# Patient Record
Sex: Female | Born: 1990 | Race: White | Hispanic: No | Marital: Married | Smoking: Never smoker
Health system: Southern US, Community
[De-identification: ages and names within clinical notes are randomized; demographics above are authoritative.]

## PROBLEM LIST (undated history)

## (undated) DIAGNOSIS — K219 Gastro-esophageal reflux disease without esophagitis: Secondary | ICD-10-CM

## (undated) DIAGNOSIS — J45909 Unspecified asthma, uncomplicated: Secondary | ICD-10-CM

## (undated) DIAGNOSIS — T7840XA Allergy, unspecified, initial encounter: Secondary | ICD-10-CM

## (undated) HISTORY — PX: TONSILLECTOMY: SUR1361

## (undated) HISTORY — PX: WISDOM TOOTH EXTRACTION: SHX21

---
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
## 2017-04-21 ENCOUNTER — Ambulatory Visit: Payer: BLUE CROSS/BLUE SHIELD | Admitting: Allergy and Immunology

## 2018-06-22 DIAGNOSIS — J45909 Unspecified asthma, uncomplicated: Secondary | ICD-10-CM | POA: Insufficient documentation

## 2018-06-22 DIAGNOSIS — K219 Gastro-esophageal reflux disease without esophagitis: Secondary | ICD-10-CM | POA: Insufficient documentation

## 2018-08-02 ENCOUNTER — Ambulatory Visit: Payer: BLUE CROSS/BLUE SHIELD | Admitting: Podiatry

## 2018-08-02 ENCOUNTER — Other Ambulatory Visit: Payer: Self-pay | Admitting: Podiatry

## 2018-08-02 ENCOUNTER — Ambulatory Visit (INDEPENDENT_AMBULATORY_CARE_PROVIDER_SITE_OTHER): Payer: BLUE CROSS/BLUE SHIELD

## 2018-08-02 ENCOUNTER — Encounter: Payer: Self-pay | Admitting: Podiatry

## 2018-08-02 DIAGNOSIS — M2580 Other specified joint disorders, unspecified joint: Secondary | ICD-10-CM

## 2018-08-02 DIAGNOSIS — M722 Plantar fascial fibromatosis: Secondary | ICD-10-CM

## 2018-08-02 NOTE — Progress Notes (Signed)
  Subjective:  Patient ID: Brandy Coleman, female    DOB: 03-27-1991,  MRN: [id number] HPI Chief Complaint  Patient presents with  . Foot Pain    Plantar forefoot and arch left - aching x 3 weeks, AM pain, noticed pain oafter wearing some non-supportive shoes all day shopping, tried ice, Ibuprofen and wearing sneakers more often, better  . New Patient (Initial Visit)    28 y.o. female presents with the above complaint.   ROS: Denies fever chills nausea vomiting muscle aches pains calf pain back pain chest pain shortness of breath.  No past medical history on file.   Current Outpatient Medications:  .  albuterol (PROVENTIL HFA;VENTOLIN HFA) 108 (90 Base) MCG/ACT inhaler, 2 puffs q.i.d. p.r.n. short of breath, wheezing, or cough, Disp: , Rfl:  .  fluticasone (FLONASE) 50 MCG/ACT nasal spray, Place into both nostrils daily., Disp: , Rfl:  .  JUNEL FE 1/20 1-20 MG-MCG tablet, Take 1 tablet by mouth daily., Disp: , Rfl:  .  montelukast (SINGULAIR) 10 MG tablet, Take 10 mg by mouth daily., Disp: , Rfl:   No Known Allergies Review of Systems Objective:  There were no vitals filed for this visit.  General: Well developed, nourished, in no acute distress, alert and oriented x3   Dermatological: Skin is warm, dry and supple bilateral. Nails x 10 are well maintained; remaining integument appears unremarkable at this time. There are no open sores, no preulcerative lesions, no rash or signs of infection present.  Vascular: Dorsalis Pedis artery and Posterior Tibial artery pedal pulses are 2/4 bilateral with immedate capillary fill time. Pedal hair growth present. No varicosities and no lower extremity edema present bilateral.   Neruologic: Grossly intact via light touch bilateral. Vibratory intact via tuning fork bilateral. Protective threshold with Semmes Wienstein monofilament intact to all pedal sites bilateral. Patellar and Achilles deep tendon reflexes 2+ bilateral. No Babinski or clonus noted  bilateral.   Musculoskeletal: No gross boney pedal deformities bilateral. No pain, crepitus, or limitation noted with foot and ankle range of motion bilateral. Muscular strength 5/5 in all groups tested bilateral.  Pain on range of motion of the first metatarsal phalangeal joint particularly plantar flexing and she has pain on palpation of the tibial sesamoid.  Gait: Unassisted, Nonantalgic.    Radiographs:  Radiographs taken today demonstrate hallux valgus deformity with tibial sesamoid sitting directly on the crista of the head of the first metatarsal.  There is no fracture of the sesamoid.  Assessment & Plan:   Assessment: Sesamoiditis with hallux valgus deformity.  Plan: Discussed the possible need for orthotics or an MRI should this continue.     Max T. Carson, [place]

## 2019-04-12 ENCOUNTER — Encounter
Disposition: A | Discharge status: Home / Self Care | Payer: Self-pay | Source: Ambulatory Visit | Attending: Obstetrics and Gynecology

## 2019-04-12 ENCOUNTER — Ambulatory Visit: Payer: BC Managed Care – PPO | Admitting: Anesthesiology

## 2019-04-12 ENCOUNTER — Encounter: Payer: Self-pay | Admitting: Emergency Medicine

## 2019-04-12 ENCOUNTER — Ambulatory Visit
Admit: 2019-04-12 | Discharge: 2019-04-12 | Disposition: A | Discharge status: Home / Self Care | Payer: BC Managed Care – PPO | Source: Ambulatory Visit | Attending: Obstetrics and Gynecology | Admitting: Obstetrics and Gynecology

## 2019-04-12 ENCOUNTER — Other Ambulatory Visit
Admission: RE | Admit: 2019-04-12 | Payer: BC Managed Care – PPO | Source: Ambulatory Visit | Admitting: Obstetrics and Gynecology

## 2019-04-12 DIAGNOSIS — O00102 Left tubal pregnancy without intrauterine pregnancy: Secondary | ICD-10-CM | POA: Diagnosis present

## 2019-04-12 DIAGNOSIS — J4520 Mild intermittent asthma, uncomplicated: Secondary | ICD-10-CM | POA: Insufficient documentation

## 2019-04-12 DIAGNOSIS — Z6835 Body mass index (BMI) 35.0-35.9, adult: Secondary | ICD-10-CM | POA: Diagnosis not present

## 2019-04-12 DIAGNOSIS — Z79899 Other long term (current) drug therapy: Secondary | ICD-10-CM | POA: Insufficient documentation

## 2019-04-12 DIAGNOSIS — E669 Obesity, unspecified: Secondary | ICD-10-CM | POA: Insufficient documentation

## 2019-04-12 DIAGNOSIS — Z8349 Family history of other endocrine, nutritional and metabolic diseases: Secondary | ICD-10-CM | POA: Diagnosis not present

## 2019-04-12 DIAGNOSIS — Z7951 Long term (current) use of inhaled steroids: Secondary | ICD-10-CM | POA: Insufficient documentation

## 2019-04-12 DIAGNOSIS — Z20828 Contact with and (suspected) exposure to other viral communicable diseases: Secondary | ICD-10-CM | POA: Diagnosis not present

## 2019-04-12 DIAGNOSIS — K219 Gastro-esophageal reflux disease without esophagitis: Secondary | ICD-10-CM | POA: Diagnosis not present

## 2019-04-12 DIAGNOSIS — Z8249 Family history of ischemic heart disease and other diseases of the circulatory system: Secondary | ICD-10-CM | POA: Diagnosis not present

## 2019-04-12 HISTORY — DX: Gastro-esophageal reflux disease without esophagitis: K21.9

## 2019-04-12 HISTORY — DX: Allergy, unspecified, initial encounter: T78.40XA

## 2019-04-12 HISTORY — PX: DIAGNOSTIC LAPAROSCOPY WITH REMOVAL OF ECTOPIC PREGNANCY: SHX6449

## 2019-04-12 HISTORY — PX: LAPAROSCOPIC UNILATERAL SALPINGECTOMY: SHX5934

## 2019-04-12 HISTORY — DX: Unspecified asthma, uncomplicated: J45.909

## 2019-04-12 LAB — CBC
HCT: 42.1 % (ref 36.0–46.0)
Hemoglobin: 13.9 g/dL (ref 12.0–15.0)
MCH: 28.6 pg (ref 26.0–34.0)
MCHC: 33 g/dL (ref 30.0–36.0)
MCV: 86.6 fL (ref 80.0–100.0)
Platelets: 233 10*3/uL (ref 150–400)
RBC: 4.86 MIL/uL (ref 3.87–5.11)
RDW: 13 % (ref 11.5–15.5)
WBC: 10.3 10*3/uL (ref 4.0–10.5)
nRBC: 0 % (ref 0.0–0.2)

## 2019-04-12 LAB — TYPE AND SCREEN
ABO/RH(D): O POS
Antibody Screen: NEGATIVE

## 2019-04-12 LAB — BASIC METABOLIC PANEL
Anion gap: 10 (ref 5–15)
BUN: 9 mg/dL (ref 6–20)
CO2: 24 mmol/L (ref 22–32)
Calcium: 9.3 mg/dL (ref 8.9–10.3)
Chloride: 104 mmol/L (ref 98–111)
Creatinine, Ser: 0.89 mg/dL (ref 0.44–1.00)
GFR calc Af Amer: >60 mL/min (ref >60)
GFR calc non Af Amer: >60 mL/min (ref >60)
Glucose, Bld: 111 mg/dL — ABNORMAL HIGH (ref 70–99)
Potassium: 4 mmol/L (ref 3.5–5.1)
Sodium: 138 mmol/L (ref 135–145)

## 2019-04-12 LAB — SARS CORONAVIRUS 2 BY RT PCR (HOSPITAL ORDER, PERFORMED IN ~~LOC~~ HOSPITAL LAB): SARS Coronavirus 2: NEGATIVE

## 2019-04-12 LAB — ABO/RH: ABO/RH(D): O POS

## 2019-04-12 SURGERY — LAPAROSCOPY, WITH ECTOPIC PREGNANCY SURGICAL TREATMENT
Anesthesia: General | Site: Abdomen | Laterality: Left

## 2019-04-12 MED ORDER — DEXAMETHASONE SODIUM PHOSPHATE 10 MG/ML IJ SOLN
INTRAMUSCULAR | Status: DC | PRN
  Administered 2019-04-12: 10 mg via INTRAVENOUS

## 2019-04-12 MED ORDER — ACETAMINOPHEN 500 MG PO TABS
1000.0000 mg | ORAL_TABLET | ORAL | Status: AC
  Administered 2019-04-12: 15:00:00 1000 mg via ORAL

## 2019-04-12 MED ORDER — SUCCINYLCHOLINE CHLORIDE 20 MG/ML IJ SOLN
INTRAMUSCULAR | Status: DC | PRN
  Administered 2019-04-12: 200 mg via INTRAVENOUS

## 2019-04-12 MED ORDER — FAMOTIDINE 20 MG PO TABS
ORAL_TABLET | ORAL | Status: AC
  Administered 2019-04-12: 20 mg via ORAL
  Filled 2019-04-12: qty 1

## 2019-04-12 MED ORDER — ROCURONIUM BROMIDE 50 MG/5ML IV SOLN
INTRAVENOUS | Status: AC
  Filled 2019-04-12: qty 1

## 2019-04-12 MED ORDER — PROMETHAZINE HCL 25 MG/ML IJ SOLN: 6.2500 mg | INTRAMUSCULAR | Status: DC | PRN

## 2019-04-12 MED ORDER — OXYCODONE HCL 5 MG/5ML PO SOLN: 5.0000 mg | Freq: Once | ORAL | Status: AC | PRN

## 2019-04-12 MED ORDER — ONDANSETRON HCL 4 MG/2ML IJ SOLN
INTRAMUSCULAR | Status: DC | PRN
  Administered 2019-04-12: 4 mg via INTRAVENOUS

## 2019-04-12 MED ORDER — GABAPENTIN 800 MG PO TABS: 800.0000 mg | ORAL_TABLET | Freq: Every day | ORAL | 0 refills | Status: DC

## 2019-04-12 MED ORDER — SUGAMMADEX SODIUM 200 MG/2ML IV SOLN
INTRAVENOUS | Status: DC | PRN
  Administered 2019-04-12: 199.6 mg via INTRAVENOUS

## 2019-04-12 MED ORDER — GABAPENTIN 300 MG PO CAPS
ORAL_CAPSULE | ORAL | Status: AC
  Administered 2019-04-12: 15:00:00 300 mg via ORAL
  Filled 2019-04-12: qty 1

## 2019-04-12 MED ORDER — FENTANYL CITRATE (PF) 100 MCG/2ML IJ SOLN
INTRAMUSCULAR | Status: AC
  Filled 2019-04-12: qty 2

## 2019-04-12 MED ORDER — OXYCODONE HCL 5 MG PO TABS
ORAL_TABLET | ORAL | Status: AC
  Filled 2019-04-12: qty 1

## 2019-04-12 MED ORDER — LACTATED RINGERS IV SOLN
INTRAVENOUS | Status: DC
  Administered 2019-04-12: 13:00:00 125 mL via INTRAVENOUS

## 2019-04-12 MED ORDER — ACETAMINOPHEN 500 MG PO TABS: 1000.0000 mg | ORAL_TABLET | Freq: Four times a day (QID) | ORAL | 0 refills | Status: AC

## 2019-04-12 MED ORDER — FENTANYL CITRATE (PF) 100 MCG/2ML IJ SOLN
INTRAMUSCULAR | Status: DC | PRN
  Administered 2019-04-12: 100 ug via INTRAVENOUS

## 2019-04-12 MED ORDER — ONDANSETRON HCL 4 MG/2ML IJ SOLN
INTRAMUSCULAR | Status: AC
  Filled 2019-04-12: qty 2

## 2019-04-12 MED ORDER — LACTATED RINGERS IV SOLN
INTRAVENOUS | Status: DC
  Administered 2019-04-12: 13:00:00 via INTRAVENOUS

## 2019-04-12 MED ORDER — DEXMEDETOMIDINE HCL IN NACL 200 MCG/50ML IV SOLN
INTRAVENOUS | Status: DC | PRN
  Administered 2019-04-12: 4 ug via INTRAVENOUS
  Administered 2019-04-12: 8 ug via INTRAVENOUS
  Administered 2019-04-12: 4 ug via INTRAVENOUS

## 2019-04-12 MED ORDER — SUGAMMADEX SODIUM 200 MG/2ML IV SOLN
INTRAVENOUS | Status: AC
  Filled 2019-04-12: qty 2

## 2019-04-12 MED ORDER — GABAPENTIN 300 MG PO CAPS
300.0000 mg | ORAL_CAPSULE | ORAL | Status: AC
  Administered 2019-04-12: 15:00:00 300 mg via ORAL

## 2019-04-12 MED ORDER — LIDOCAINE HCL (CARDIAC) PF 100 MG/5ML IV SOSY
PREFILLED_SYRINGE | INTRAVENOUS | Status: DC | PRN
  Administered 2019-04-12: 100 mg via INTRAVENOUS

## 2019-04-12 MED ORDER — DOCUSATE SODIUM 100 MG PO CAPS: 100.0000 mg | ORAL_CAPSULE | Freq: Two times a day (BID) | ORAL | 0 refills | Status: DC

## 2019-04-12 MED ORDER — MIDAZOLAM HCL 2 MG/2ML IJ SOLN
INTRAMUSCULAR | Status: DC | PRN
  Administered 2019-04-12: 2 mg via INTRAVENOUS

## 2019-04-12 MED ORDER — BUPIVACAINE HCL 0.5 % IJ SOLN
INTRAMUSCULAR | Status: DC | PRN
  Administered 2019-04-12: 10 mL

## 2019-04-12 MED ORDER — PROPOFOL 10 MG/ML IV BOLUS
INTRAVENOUS | Status: AC
  Filled 2019-04-12: qty 20

## 2019-04-12 MED ORDER — MEPERIDINE HCL 50 MG/ML IJ SOLN: 6.2500 mg | INTRAMUSCULAR | Status: DC | PRN

## 2019-04-12 MED ORDER — OXYCODONE HCL 5 MG PO CAPS: 5.0000 mg | ORAL_CAPSULE | Freq: Four times a day (QID) | ORAL | 0 refills | Status: DC | PRN

## 2019-04-12 MED ORDER — IBUPROFEN 800 MG PO TABS: 800.0000 mg | ORAL_TABLET | Freq: Three times a day (TID) | ORAL | 1 refills | Status: AC

## 2019-04-12 MED ORDER — LIDOCAINE HCL (PF) 2 % IJ SOLN
INTRAMUSCULAR | Status: AC
  Filled 2019-04-12: qty 10

## 2019-04-12 MED ORDER — OXYCODONE HCL 5 MG PO TABS
5.0000 mg | ORAL_TABLET | Freq: Once | ORAL | Status: AC | PRN
  Administered 2019-04-12: 18:00:00 5 mg via ORAL

## 2019-04-12 MED ORDER — FAMOTIDINE 20 MG PO TABS
20.0000 mg | ORAL_TABLET | Freq: Once | ORAL | Status: AC
  Administered 2019-04-12: 15:00:00 20 mg via ORAL

## 2019-04-12 MED ORDER — DEXAMETHASONE SODIUM PHOSPHATE 10 MG/ML IJ SOLN
INTRAMUSCULAR | Status: AC
  Filled 2019-04-12: qty 1

## 2019-04-12 MED ORDER — ACETAMINOPHEN 500 MG PO TABS
ORAL_TABLET | ORAL | Status: AC
  Administered 2019-04-12: 15:00:00 1000 mg via ORAL
  Filled 2019-04-12: qty 2

## 2019-04-12 MED ORDER — MIDAZOLAM HCL 2 MG/2ML IJ SOLN
INTRAMUSCULAR | Status: AC
  Filled 2019-04-12: qty 2

## 2019-04-12 MED ORDER — FENTANYL CITRATE (PF) 100 MCG/2ML IJ SOLN: 25.0000 ug | INTRAMUSCULAR | Status: DC | PRN

## 2019-04-12 MED ORDER — PROPOFOL 10 MG/ML IV BOLUS
INTRAVENOUS | Status: DC | PRN
  Administered 2019-04-12: 200 mg via INTRAVENOUS

## 2019-04-12 MED ORDER — ROCURONIUM BROMIDE 100 MG/10ML IV SOLN
INTRAVENOUS | Status: DC | PRN
  Administered 2019-04-12: 40 mg via INTRAVENOUS

## 2019-04-12 SURGICAL SUPPLY — 51 items
BAG URINE DRAINAGE (UROLOGICAL SUPPLIES) ×3 IMPLANT
BLADE SURG SZ11 CARB STEEL (BLADE) ×3 IMPLANT
CATH FOLEY 2WAY  5CC 16FR (CATHETERS) ×2
CATH URTH 16FR FL 2W BLN LF (CATHETERS) ×1 IMPLANT
CHLORAPREP W/TINT 26 (MISCELLANEOUS) ×3 IMPLANT
CLOSURE WOUND 1/4X4 (GAUZE/BANDAGES/DRESSINGS) ×1
COVER WAND RF STERILE (DRAPES) IMPLANT
DERMABOND ADVANCED (GAUZE/BANDAGES/DRESSINGS) ×2
DERMABOND ADVANCED .7 DNX12 (GAUZE/BANDAGES/DRESSINGS) ×1 IMPLANT
DRAPE GENERAL ENDO 106X123.5 (DRAPES) ×3 IMPLANT
DRAPE LEGGINS SURG 28X43 STRL (DRAPES) ×3 IMPLANT
DRAPE STERI POUCH LG 24X46 STR (DRAPES) IMPLANT
DRAPE UNDER BUTTOCK W/FLU (DRAPES) ×3 IMPLANT
DRSG TEGADERM 2-3/8X2-3/4 SM (GAUZE/BANDAGES/DRESSINGS) ×9 IMPLANT
GLOVE BIO SURGEON STRL SZ7 (GLOVE) ×6 IMPLANT
GLOVE INDICATOR 7.5 STRL GRN (GLOVE) ×3 IMPLANT
GOWN STRL REUS W/ TWL LRG LVL3 (GOWN DISPOSABLE) ×2 IMPLANT
GOWN STRL REUS W/TWL LRG LVL3 (GOWN DISPOSABLE) ×4
GRASPER SUT TROCAR 14GX15 (MISCELLANEOUS) ×3 IMPLANT
IRRIGATION STRYKERFLOW (MISCELLANEOUS) ×1 IMPLANT
IRRIGATOR STRYKERFLOW (MISCELLANEOUS) ×3
IV NS 1000ML (IV SOLUTION) ×2
IV NS 1000ML BAXH (IV SOLUTION) ×1 IMPLANT
KIT PINK PAD W/HEAD ARE REST (MISCELLANEOUS) ×3
KIT PINK PAD W/HEAD ARM REST (MISCELLANEOUS) ×1 IMPLANT
KIT TURNOVER CYSTO (KITS) ×3 IMPLANT
LABEL OR SOLS (LABEL) ×3 IMPLANT
LIGASURE LAP MARYLAND 5MM 37CM (ELECTROSURGICAL) IMPLANT
LIGASURE VESSEL 5MM BLUNT TIP (ELECTROSURGICAL) ×3 IMPLANT
NEEDLE FILTER BLUNT 18X 1/2SAF (NEEDLE) ×2
NEEDLE FILTER BLUNT 18X1 1/2 (NEEDLE) ×1 IMPLANT
NS IRRIG 500ML POUR BTL (IV SOLUTION) ×3 IMPLANT
PACK GYN LAPAROSCOPIC (MISCELLANEOUS) ×3 IMPLANT
PAD OB MATERNITY 4.3X12.25 (PERSONAL CARE ITEMS) ×3 IMPLANT
PAD PREP 24X41 OB/GYN DISP (PERSONAL CARE ITEMS) ×3 IMPLANT
POUCH SPECIMEN RETRIEVAL 10MM (ENDOMECHANICALS) ×3 IMPLANT
SCISSORS METZENBAUM CVD 33 (INSTRUMENTS) IMPLANT
SET TUBE SMOKE EVAC HIGH FLOW (TUBING) ×3 IMPLANT
SLEEVE ENDOPATH XCEL 5M (ENDOMECHANICALS) ×3 IMPLANT
SPONGE GAUZE 2X2 8PLY STER LF (GAUZE/BANDAGES/DRESSINGS) ×3
SPONGE GAUZE 2X2 8PLY STRL LF (GAUZE/BANDAGES/DRESSINGS) ×6 IMPLANT
STRIP CLOSURE SKIN 1/4X4 (GAUZE/BANDAGES/DRESSINGS) ×2 IMPLANT
SUT MNCRL AB 4-0 PS2 18 (SUTURE) ×3 IMPLANT
SUT VIC AB 2-0 UR6 27 (SUTURE) ×3 IMPLANT
SUT VIC AB 4-0 SH 27 (SUTURE) ×2
SUT VIC AB 4-0 SH 27XANBCTRL (SUTURE) ×1 IMPLANT
SYR 50ML LL SCALE MARK (SYRINGE) IMPLANT
SYR 5ML LL (SYRINGE) ×3 IMPLANT
TROCAR XCEL NON-BLD 11X100MML (ENDOMECHANICALS) ×3 IMPLANT
TROCAR XCEL NON-BLD 5MMX100MML (ENDOMECHANICALS) ×3 IMPLANT
TUBING ART PRESS 48 MALE/FEM (TUBING) IMPLANT

## 2019-04-12 NOTE — H&P (Signed)
Brandy Coleman is a 28 y.o. female here for No chief complaint on file.  History of Present Illness: Patient present to the clinic today for an ultrasound due to bleeding and discomfort in early pregnancy. Ultrasound demonstrated 4cm adnexal mass with nothing in the uterus. 3K beta hcg 5 days ago. No acute distress  TVUS Today: Uterus anteverted LMP=02/23/2019 No IUP seen Endometrium=7.41mm Free fluid seen in ACDS & PCDS B/L ovaries appear wnl Lt adnexal mass=3.94 x 3.08 x 3.42cm   Past Medical History:  has a past medical history of Chickenpox (02/1999), Gastroesophageal reflux disease without esophagitis (06/22/2018), and Mild intermittent reactive airway disease without complication (06/22/2018).  Past Surgical History:  has a past surgical history that includes Tonsillectomy and wisdom teeth extraction. Family History: family history includes Atrial fibrillation (Abnormal heart rhythm sometimes requiring treatment with blood thinners) in her father; High blood pressure (Hypertension) in her father; Osteopenia in her mother; Thyroid disease in her maternal grandmother; Thyroid disease (age of onset: 43) in her mother. Social History:  reports that she has never smoked. She has never used smokeless tobacco. She reports current alcohol use. She reports that she does not use drugs. OB/GYN History:  OB History    Gravida  0   Para  0   Term  0   Preterm  0   AB  0   Living  0     SAB  0   TAB  0   Ectopic  0   Molar      Multiple  0   Live Births           Allergies: has No Known Allergies. Medications: Current Outpatient Medications:  .  albuterol 90 mcg/actuation inhaler, 2 puffs q.i.d. p.r.n. short of breath, wheezing, or cough, Disp: 1 Inhaler, Rfl: 11 .  albuterol sulfate (PROAIR RESPICLICK) 90 mcg/actuation AePB, Inhale 180 mcg into the lungs every 4 (four) hours., Disp: 1 each, Rfl: 2 .  montelukast (SINGULAIR) 10 mg tablet, Take 1 tablet (10 mg total) by  mouth once daily, Disp: 30 tablet, Rfl: 11 .  nitrofurantoin, macrocrystal-monohydrate, (MACROBID) 100 MG capsule, Take 1 capsule (100 mg total) by mouth 2 (two) times daily for 7 days, Disp: 14 capsule, Rfl: 0 .  norethindrone-ethinyl estradiol (JUNEL FE 1/20, 28,) 1 mg-20 mcg (21)/75 mg (7) tablet, Take 1 tablet by mouth once daily, Disp: 4 Package, Rfl: 3  Review of Systems: No SOB, no palpitations or chest pain, no new lower extremity edema, no nausea or vomiting or bowel or bladder complaints. See HPI for gyn specific ROS.   Exam:   Vitals:   04/12/19 1237  BP: 110/80  Pulse: 106    Constitutional:  General appearance: Well nourished, well developed female in no acute distress.  Neuro/psych:  Normal mood and affect. No gross motor deficits. Tears due to nature of visit CV: RRR Pulm: CTAB Neck:  Supple, normal appearance.  Respiratory:  Normal respiratory effort, no use of accessory muscles Skin:  No visible rashes or external lesions  Impression:   The primary encounter diagnosis was Left tubal pregnancy without intrauterine pregnancy. Diagnoses of Unsure of LMP (last menstrual period) as reason for ultrasound scan and Vaginal bleeding in pregnancy were also pertinent to this visit.  Plan:   1. Ectopic Pregnancy - Risks of ectopic pregnancy in general were d/w the patient including the risk of tubal rupture, the need for emergent surgery, internal bleeding and even death.  Option of surgery -vs- Methotrexate (  MTX) were d/w the patient.  Risks of surgery including bleeding which may require transfusion or reoperation, infection, injury to bowel or other surrounding organs, need for additional procedures including (laparoscopy or) laparotomy were explained to patient and written informed consent was obtained.  Patient has been NPO since 7:30 and she will remain NPO for procedure. Anesthesia and OR aware.   SCDs ordered on call to the OR.  To OR when ready.    Diagnoses and all  orders for this visit:  Left tubal pregnancy without intrauterine pregnancy  Unsure of LMP (last menstrual period) as reason for ultrasound scan -     US OB transvaginal  Vaginal bleeding in pregnancy -     US OB transvaginal  Return in about 2 weeks (around 04/26/2019) for Postop check.

## 2019-04-12 NOTE — Op Note (Signed)
Brandy Coleman PROCEDURE DATE: 04/12/2019  PREOPERATIVE DIAGNOSIS: Ruptured ectopic pregnancy POSTOPERATIVE DIAGNOSIS: Ruptured left fallopian tube ectopic pregnancy PROCEDURE: Laparoscopic left salpingectomy and removal of ectopic pregnancy SURGEON:  Dr. Benjaman Kindler ANESTHESIOLOGIST: Piscitello, Precious Haws, MD Anesthesiologist: Piscitello, Precious Haws, MD CRNA: Lavone Orn, CRNA; Jerrye Noble, CRNA  INDICATIONS: 28 y.o. No obstetric history on file. at Unknown here with the preoperative diagnoses as listed above.  Please refer to preoperative notes for more details. Patient was counseled regarding need for laparoscopic salpingectomy. Risks of surgery including bleeding which may require transfusion or reoperation, infection, injury to bowel or other surrounding organs, need for additional procedures including laparotomy and other postoperative/anesthesia complications were explained to patient.  Written informed consent was obtained.  FINDINGS:  Large amount of hemoperitoneum estimated to be about 193ml of blood and clots.  Dilated left fallopian tube containing ectopic gestation. Fimbriae appeared distorted and ruptured. Small normal appearing uterus, normal right fallopian tube, right ovary and left ovary.  ANESTHESIA: General INTRAVENOUS FLUIDS: 800 ml ESTIMATED BLOOD LOSS: 25 ml HEMOPERITONEUM: 180ml URINE OUTPUT: 100 ml SPECIMENS: Left fallopian tube containing ectopic gestation COMPLICATIONS: None immediate  PROCEDURE IN DETAIL:  The patient was taken to the operating room where general anesthesia was administered and was found to be adequate.  She was placed in the dorsal lithotomy position, and was prepped and draped in a sterile manner.  Her bladder was catheterized for an estimated amount of clear, yellow urine. A weighed speculum was then placed in the patient's vagina and a single tooth tenaculum was applied to the anterior lip of the cervix.  Attention was turned  to the abdomen where an umbilical incision was made with the scalpel.  The Optiview 5-mm trocar and sleeve were then advanced without difficulty. Survey of the entry site was noted to be without damage. The abdomen was then insufflated with carbon dioxide gas and adequate pneumoperitoneum was obtained.  A survey of the patient's pelvis and abdomen revealed the findings above.  A 11-mm port was placed on the left under direct visualization, and a 5-mm right lower quadrant port was then placed under direct visualization.  The Nezhat suction irrigator was then used to suction the hemoperitoneum and irrigate the pelvis.  A survey of the pelvis noted ureter peristalsis deep to the ovarian ligament and not in the surgical field. Attention was then turned to the left fallopian tube and ovary. The left tube was separated from the underlying mesosalpinx and uterine attachment using the Ligasure instrument.  Good hemostasis was noted.  The specimen was placed in an EndoCatch bag and removed from the abdomen intact.  The abdomen was desufflated, and all instruments were removed.  The fascial incision of the 10-mm site was reapproximated with a 0 Vicryl figure-of-eight stitch; and all skin incisions were closed with 4-0 Vicryl and Dermabond. The patient tolerated the procedure well.  All instruments, needles, and sponge counts were correct x 2. The patient was taken to the recovery room in stable condition.   Angelina Pih, MD, MPH

## 2019-04-12 NOTE — Anesthesia Post-op Follow-up Note (Signed)
Anesthesia QCDR form completed.        

## 2019-04-12 NOTE — Transfer of Care (Signed)
Immediate Anesthesia Transfer of Care Note  Patient: Brandy Coleman  Procedure(s) Performed: LAPAROSCOPY WITH REMOVAL OF ECTOPIC PREGNANCY (Left Abdomen) LAPAROSCOPIC LEFT SALPINGECTOMY (Left Abdomen)  Patient Location: PACU  Anesthesia Type:General  Level of Consciousness: awake and drowsy  Airway & Oxygen Therapy: Patient Spontanous Breathing and Patient connected to face mask oxygen  Post-op Assessment: Report given to RN and Post -op Vital signs reviewed and stable  Post vital signs: Reviewed and stable  Last Vitals:  Vitals Value Taken Time  BP 125/72 04/12/19 1730  Temp    Pulse 98 04/12/19 1732  Resp 23 04/12/19 1732  SpO2 100 % 04/12/19 1732  Vitals shown include unvalidated device data.  Last Pain:  Vitals:   04/12/19 1256  TempSrc: Tympanic  PainSc: 0-No pain         Complications: No apparent anesthesia complications

## 2019-04-12 NOTE — Anesthesia Postprocedure Evaluation (Signed)
Anesthesia Post Note  Patient: Brandy Coleman  Procedure(s) Performed: LAPAROSCOPY WITH REMOVAL OF ECTOPIC PREGNANCY (Left Abdomen) LAPAROSCOPIC LEFT SALPINGECTOMY (Left Abdomen)  Patient location during evaluation: PACU Anesthesia Type: General Level of consciousness: awake and alert Pain management: pain level controlled Vital Signs Assessment: post-procedure vital signs reviewed and stable Respiratory status: spontaneous breathing, nonlabored ventilation, respiratory function stable and patient connected to nasal cannula oxygen Cardiovascular status: blood pressure returned to baseline and stable Postop Assessment: no apparent nausea or vomiting Anesthetic complications: no     Last Vitals:  Vitals:   04/12/19 1745 04/12/19 1757  BP: 118/66 118/66  Pulse: 95 84  Resp: 18 17  Temp:  36.6 C  SpO2: 100% 97%    Last Pain:  Vitals:   04/12/19 1757  TempSrc: Tympanic  PainSc: 4                  Precious Haws Oluwatobi Ruppe

## 2019-04-12 NOTE — Anesthesia Preprocedure Evaluation (Addendum)
Anesthesia Evaluation  Patient identified by MRN, date of birth, ID band Patient awake    Reviewed: Allergy & Precautions, H&P , NPO status , Patient's Chart, lab work & pertinent test results  History of Anesthesia Complications Negative for: history of anesthetic complications  Airway Mallampati: II  TM Distance: >3 FB Neck ROM: Full    Dental no notable dental hx. (+) Chipped   Pulmonary neg shortness of breath, asthma , neg sleep apnea,    breath sounds clear to auscultation- rhonchi (-) wheezing      Cardiovascular Exercise Tolerance: Good (-) hypertension(-) angina(-) CAD, (-) Past MI, (-) Cardiac Stents and (-) CABG negative cardio ROS   Rhythm:Regular Rate:Normal - Systolic murmurs and - Diastolic murmurs    Neuro/Psych neg Seizures negative neurological ROS  negative psych ROS   GI/Hepatic Neg liver ROS, GERD  Controlled,  Endo/Other  negative endocrine ROSneg diabetes  Renal/GU negative Renal ROS     Musculoskeletal negative musculoskeletal ROS (+)   Abdominal (+) + obese,   Peds  Hematology negative hematology ROS (+)   Anesthesia Other Findings Past Medical History: No date: Allergies No date: Asthma No date: GERD (gastroesophageal reflux disease)   Reproductive/Obstetrics negative OB ROS (+) Pregnancy (ectopic)                            Anesthesia Physical Anesthesia Plan  ASA: III  Anesthesia Plan: General ETT   Post-op Pain Management:    Induction: Intravenous  PONV Risk Score and Plan: 2 and Ondansetron, Dexamethasone and Midazolam  Airway Management Planned: Oral ETT  Additional Equipment:   Intra-op Plan:   Post-operative Plan: Extubation in OR  Informed Consent: I have reviewed the patients History and Physical, chart, labs and discussed the procedure including the risks, benefits and alternatives for the proposed anesthesia with the patient or  authorized representative who has indicated his/her understanding and acceptance.     Dental advisory given  Plan Discussed with: CRNA and Anesthesiologist  Anesthesia Plan Comments: (Ate breakfast at 0730, will be NPO appropriate at 1530)       Anesthesia Quick Evaluation

## 2019-04-12 NOTE — Anesthesia Procedure Notes (Signed)
Procedure Name: Intubation Date/Time: 04/12/2019 4:16 PM Performed by: Lavone Orn, CRNA Pre-anesthesia Checklist: Patient identified, Emergency Drugs available, Suction available, Patient being monitored and Timeout performed Patient Re-evaluated:Patient Re-evaluated prior to induction Oxygen Delivery Method: Circle system utilized Preoxygenation: Pre-oxygenation with 100% oxygen Induction Type: IV induction Ventilation: Mask ventilation without difficulty Laryngoscope Size: Mac and 3 Grade View: Grade II Tube type: Oral Tube size: 7.0 mm Number of attempts: 1 Airway Equipment and Method: Stylet Placement Confirmation: ETT inserted through vocal cords under direct vision,  positive ETCO2 and breath sounds checked- equal and bilateral Secured at: 22 cm Tube secured with: Tape Dental Injury: Teeth and Oropharynx as per pre-operative assessment

## 2019-04-12 NOTE — Discharge Instructions (Signed)
AMBULATORY SURGERY  DISCHARGE INSTRUCTIONS   1) The drugs that you were given will stay in your system until tomorrow so for the next 24 hours you should not:  A) Drive an automobile B) Make any legal decisions C) Drink any alcoholic beverage   2) You may resume regular meals tomorrow.  Today it is better to start with liquids and gradually work up to solid foods.  You may eat anything you prefer, but it is better to start with liquids, then soup and crackers, and gradually work up to solid foods.   3) Please notify your doctor immediately if you have any unusual bleeding, trouble breathing, redness and pain at the surgery site, drainage, fever, or pain not relieved by medication. 4)   5) Your post-operative visit with Dr.                                     is: Date:                        Time:    Please call to schedule your post-operative visit.  6) Additional Instructions:          Laparoscopic Tubal Removal for Ectopic Pregnancy Discharge Instructions  Laparoscopic tubal removal is a procedure that removes the fallopian tube containing the ectopic pregnancy.  For the next three days, take ibuprofen and acetaminophen on a schedule, every 8 hours. You can take them together or you can intersperse them, and take one every four hours. I also gave you gabapentin for nighttime, to help you sleep and also to control pain. Take gabapentin medicines at night for at least the next 3 nights. You also have a narcotic, oxycodone, to take as needed if the above medicines don't help.  Postop constipation is a major cause of pain. Stay well hydrated, walk as you tolerate, and take over the counter senna as well as stool softeners if you need them.  RISKS AND COMPLICATIONS   Infection.  Bleeding.  Injury to surrounding organs.  Anesthetic side effects.  Failure of the procedure.  Risks of future ectopic pregnancy on the other side PROCEDURE   You may be given a medicine  to help you relax (sedative) before the procedure. You will be given a medicine to make you sleep (general anesthetic) during the procedure.  A tube will be put down your throat to help your breath while under general anesthesia.  Two small cuts (incisions) are made in the lower abdominal area and one incision is made near the belly button.  Your abdominal area will be inflated with a safe gas (carbon dioxide). This helps give the surgeon room to operate, visualize, and helps the surgeon avoid other organs.  A thin, lighted tube (laparoscope) with a camera attached is inserted into your abdomen through the incision near the belly button. Other small instruments are also inserted through the other abdominal incisions.  The fallopian tube is located and are removed.  After the fallopian tube is removed, the gas is released from the abdomen.  The incisions will be closed with stitches (sutures), and Dermabond. A bandage may be placed over the incisions. AFTER THE PROCEDURE   You will also have some mild abdominal discomfort for 3-7 days. You will be given pain medicine to ease any discomfort.  As long as there are no problems, you may be allowed to go  home. Someone will need to drive you home and be with you for at least 24 hours once home.  You may have some mild discomfort in the throat. This is from the tube placed in your throat while you were sleeping.  You may experience discomfort in the shoulder area from some trapped air between the liver and diaphragm. This sensation is normal and will slowly go away on its own. HOME CARE INSTRUCTIONS   Take all medicines as directed.  Only take over-the-counter or prescription medicines for pain, discomfort, or fever as directed by your caregiver.  Resume daily activities as directed.  Showers are preferred over baths.  You may resume sexual activities in 1 week or as directed.  Do not drive while taking narcotics. SEEK MEDICAL CARE IF:  .  There is increasing abdominal pain.  You feel lightheaded or faint.  You have the chills.  You have an oral temperature above 102 F (38.9 C).  There is pus-like (purulent) drainage from any of the wounds.  You are unable to pass gas or have a bowel movement.  You feel sick to your stomach (nauseous) or throw up (vomit). MAKE SURE YOU:   Understand these instructions.  Will watch your condition.  Will get help right away if you are not doing well or get worse.  ExitCare Patient Information 2013 [place].

## 2019-04-13 ENCOUNTER — Encounter: Payer: Self-pay | Admitting: Obstetrics and Gynecology

## 2019-04-14 LAB — SURGICAL PATHOLOGY

## 2019-07-15 NOTE — L&D Delivery Note (Signed)
Date of delivery: 05/16/2020 Estimated Date of Delivery: 05/14/20 Patient's last menstrual period was 08/08/2019. EGA: [redacted]w[redacted]d  Delivery Note At 9:23 PM a viable female was delivered via Vaginal, Spontaneous (Presentation: Left Occiput Anterior).  APGAR: 2, 9; weight 10 lb 0.1 oz (4540 g).   Placenta status: Spontaneous, Intact.  Cord: 3 vessels with the following complications: tight nuchal x1.  Cord pH: collected but not sent per pediatric team.  Anesthesia: Epidural Episiotomy: None Lacerations: 2nd degree Perineal, deep bilateral vaginal tears (9cm each) Suture Repair: 3.0 vicryl Est. Blood Loss (mL): 1385cc  Mom presented to L&D with IOL for past due date, and suspected macrosomia.  She was given cytotec, augmented with pitocin. AROM'd. epidual placed. Progressed to complete, second stage:  1.5hrs, with delivery of fetal head with restitution to LOT. Nuchal cord was tight and not able to be reduced at the perineum.  Shoulder dystocia encountered.  McRoberts and woods corkscrew performed with right anterior shoulder then delivered, and remainder of newborn delivered without difficulty.  Nuchal reduced immediately upon delivery, and baby was floppy.  Cord immediately cut and pediatric team brought baby to the warmer for evaluation and intervention.  A segment of cord was portioned off and cord gas was collected. Cord blood also collected for typing. Placenta spontaneously delivered, intact, and IV pitocin given for hemorrhage prophylaxis.  There were two deep and long bilateral vaginal tears.  Each were 9cm long and 2cm deep.  They were each closed with running locking stitches and intermittent interrupted sutures of 3-0 vicryl.  The 2nd degree perineal was closed with standard crown and subcuticular stitches. Of note, the majority of the blood loss was from the vaginal lacerations, and not from uterine atony. Her uterus remained firm post-delivery with minimal blood expressed with massage.  No  uterotonics were necessary.   After adequate assessment the pediatric team, baby was placed on mom's chest for skin to skin.    We sang happy birthday to baby Felecia Jan to postpartum.  Baby to Couplet care / Skin to Skin.  Ylonda Storr C Andrw Mcguirt 05/16/2020, 10:52 PM

## 2019-07-19 ENCOUNTER — Ambulatory Visit: Payer: BC Managed Care – PPO | Admitting: Urology

## 2019-07-19 ENCOUNTER — Other Ambulatory Visit: Payer: Self-pay

## 2019-07-19 ENCOUNTER — Encounter: Payer: Self-pay | Admitting: Urology

## 2019-07-19 VITALS — BP 174/107 | HR 99 | Ht 66.0 in | Wt 209.0 lb

## 2019-07-19 DIAGNOSIS — R310 Gross hematuria: Secondary | ICD-10-CM

## 2019-07-19 DIAGNOSIS — N390 Urinary tract infection, site not specified: Secondary | ICD-10-CM | POA: Diagnosis not present

## 2019-07-19 LAB — URINALYSIS, COMPLETE
Bilirubin, UA: NEGATIVE
Glucose, UA: NEGATIVE
Ketones, UA: NEGATIVE
Leukocytes,UA: NEGATIVE
Nitrite, UA: NEGATIVE
Protein,UA: NEGATIVE
RBC, UA: NEGATIVE
Specific Gravity, UA: 1.01 (ref 1.005–1.030)
Urobilinogen, Ur: 0.2 mg/dL (ref 0.2–1.0)
pH, UA: 6.5 (ref 5.0–7.5)

## 2019-07-19 LAB — MICROSCOPIC EXAMINATION: RBC, Urine: NONE SEEN /hpf (ref 0–2)

## 2019-07-19 NOTE — Progress Notes (Signed)
07/19/2019 2:04 PM   Brandy Coleman 1991/06/03 [id number]  Referring provider: Rusty Aus, MD [facility] [place],  [place] [address]  Chief Complaint  Patient presents with  . Urinary Tract Infection    HPI: 29 year old female who presents today for further evaluation of blood in the urine and possible urinary tract infections.  She believes that over the past year, she has had several infections are treated and she is worried that something may be going on.  She stopped taking birth control in the middle of last year she and her husband and actively trying to reproduce with more frequent intercourse.  She is had 1 true documented infection over the past year.  This was on 12/19/2018.  At that time, her urine was grossly positive and she grew E. coli.  At the time, she had frequency urgency dysuria and symptoms consistent with traditional urinary tract infection.  She did have a suspicious appearing urine on 04/07/2019 which was nitrate positive with numerous red blood cells but no white blood cells.  Urine culture on this occasion was negative.  This was in and around the time of having an ectopic pregnancy requiring surgery.  She was treated presumptively for urinary tract infection.  She was having adnexal pain at the time related to her  She also reports that she had a urine checked in mid December which actually was fairly unremarkable.  She says she was asymptomatic at the time and treated by her PCP.  I see no documentation to effectively treated for this.  Recently, she was seen and evaluated on 07/16/2019 at urgent care she had a few red blood cells, 4-10, otherwise her urine was unremarkable.  Rare bacteria.  No leukocytes.  Nitrate negative.  No culture was sent.  She was treated with Cipro as a precaution.  Symptoms at the time included increased frequency, urgency, dysuria and mild gross hematuria.  Her symptoms have improved on this  antibiotic.  She has been taking over-the-counter cranberry pills daily.  She is sexually active.  No issues with constipation.  She does engage in good hygiene techniques.  No history of significant urinary tract infections in the past.   PMH: Past Medical History:  Diagnosis Date  . Allergies   . Asthma   . GERD (gastroesophageal reflux disease)     Surgical History: Past Surgical History:  Procedure Laterality Date  . DIAGNOSTIC LAPAROSCOPY WITH REMOVAL OF ECTOPIC PREGNANCY Left 04/12/2019   Procedure: LAPAROSCOPY WITH REMOVAL OF ECTOPIC PREGNANCY;  Surgeon: Benjaman Kindler, MD;  Location: [facility] ORS;  Service: Gynecology;  Laterality: Left;  . LAPAROSCOPIC UNILATERAL SALPINGECTOMY Left 04/12/2019   Procedure: LAPAROSCOPIC LEFT SALPINGECTOMY;  Surgeon: Benjaman Kindler, MD;  Location: [facility] ORS;  Service: Gynecology;  Laterality: Left;  . TONSILLECTOMY    . WISDOM TOOTH EXTRACTION      Home Medications:  Allergies as of 07/19/2019   No Known Allergies     Medication List       Accurate as of July 19, 2019 11:59 PM. If you have any questions, ask your nurse or doctor.        STOP taking these medications   docusate sodium 100 MG capsule Commonly known as: COLACE Stopped by: Hollice Espy, MD   gabapentin 800 MG tablet Commonly known as: Neurontin Stopped by: Hollice Espy, MD   nitrofurantoin (macrocrystal-monohydrate) 100 MG capsule Commonly known as: MACROBID Stopped by: Hollice Espy, MD   oxycodone 5 MG  capsule Commonly known as: OXY-IR Stopped by: Vanna Scotland, MD     TAKE these medications   albuterol 108 (90 Base) MCG/ACT inhaler Commonly known as: VENTOLIN HFA 2 puffs q.i.d. p.r.n. short of breath, wheezing, or cough   ciprofloxacin 500 MG tablet Commonly known as: CIPRO Take 500 mg by mouth 2 (two) times daily.   fluticasone 50 MCG/ACT nasal spray Commonly known as: FLONASE Place into both nostrils daily.   montelukast 10 MG  tablet Commonly known as: SINGULAIR Take 10 mg by mouth daily.       Allergies: No Known Allergies  Family History: No family history on file.  Social History:  reports that she has never smoked. She has never used smokeless tobacco. She reports current alcohol use. She reports that she does not use drugs.  ROS: UROLOGY Frequent Urination?: Yes Hard to postpone urination?: No Burning/pain with urination?: Yes Get up at night to urinate?: No Leakage of urine?: No Urine stream starts and stops?: No Trouble starting stream?: No Do you have to strain to urinate?: No Blood in urine?: No Urinary tract infection?: No Sexually transmitted disease?: No Injury to kidneys or bladder?: No Painful intercourse?: No Weak stream?: No Currently pregnant?: No Vaginal bleeding?: No Last menstrual period?: N  Gastrointestinal Nausea?: No Vomiting?: No Indigestion/heartburn?: No Diarrhea?: No Constipation?: No  Constitutional Fever: No Night sweats?: No Weight loss?: No Fatigue?: No  Skin Skin rash/lesions?: No Itching?: No  Eyes Blurred vision?: No Double vision?: No  Ears/Nose/Throat Sore throat?: No Sinus problems?: No  Hematologic/Lymphatic Swollen glands?: No Easy bruising?: No  Cardiovascular Leg swelling?: No Chest pain?: No  Respiratory Cough?: No Shortness of breath?: No  Endocrine Excessive thirst?: No  Musculoskeletal Back pain?: No Joint pain?: No  Neurological Headaches?: No Dizziness?: No  Psychologic Depression?: No Anxiety?: No  Physical Exam: BP (!) 174/107   Pulse 99   Ht 5\' 6"  (1.676 m)   Wt 209 lb (94.8 kg)   BMI 33.73 kg/m   Constitutional:  Alert and oriented, No acute distress. HEENT: [place] AT, moist mucus membranes.  Trachea midline, no masses. Cardiovascular: No clubbing, cyanosis, or edema. Respiratory: Normal respiratory effort, no increased work of breathing. Skin: No rashes, bruises or suspicious  lesions. Neurologic: Grossly intact, no focal deficits, moving all 4 extremities. Psychiatric: Normal mood and affect.  Laboratory Data: Lab Results  Component Value Date   WBC 10.3 04/12/2019   HGB 13.9 04/12/2019   HCT 42.1 04/12/2019   MCV 86.6 04/12/2019   PLT 233 04/12/2019    Lab Results  Component Value Date   CREATININE 0.89 04/12/2019   Urinalysis Results for orders placed or performed in visit on 07/19/19  Microscopic Examination   URINE  Result Value Ref Range   WBC, UA 6-10 (A) 0 - 5 /hpf   RBC None seen 0 - 2 /hpf   Epithelial Cells (non renal) 0-10 0 - 10 /hpf   Bacteria, UA Many (A) None seen/Few  Urinalysis, Complete  Result Value Ref Range   Specific Gravity, UA 1.010 1.005 - 1.030   pH, UA 6.5 5.0 - 7.5   Color, UA Straw Yellow   Appearance Ur Cloudy (A) Clear   Leukocytes,UA Negative Negative   Protein,UA Negative Negative/Trace   Glucose, UA Negative Negative   Ketones, UA Negative Negative   RBC, UA Negative Negative   Bilirubin, UA Negative Negative   Urobilinogen, Ur 0.2 0.2 - 1.0 mg/dL   Nitrite, UA Negative Negative  Microscopic Examination See below:     Pertinent Imaging: n/a  Assessment & Plan:    1. Recurrent UTI Relatively healthy 29 year old female with several urinary tract infections this year, although only one is culture proven and documented.  We discussed the pathophysiology of recurrent urinary tract infections.  Increase infection is likely secondary to increasing frequency of intercourse and hormonally related.  At this point time, I do not feel that she has had enough infections, 3 documented in a calendar year to pursue any further evaluation including cystoscopy and renal ultrasound.  We discussed hygiene at length today including good technique.  Agree with daily cranberry tablets which she can increase to twice a day.  Also discussed the addition of probiotic.  I advised her that if she feels like she has a  urinary tract infection, we will see her as a same-day work in if it is during the week for UA urine culture.  If she does have an increasing number of infections, will pursue further evaluation and consideration of postcoital antibiotics.  Antibiotic stewardship was also discussed.  She is agreeable this above plan. - Urinalysis, Complete - Urinalysis, Complete; Future - CULTURE, URINE COMPREHENSIVE; Future  2. Gross hematuria Patient is concerned today about episodes of gross hematuria possibly related to infections  No evidence of microscopic blood in her urine today which is reassuring  For peace of mind, she was asked to drop off another urine in about 6 weeks to ensure that there is no additional concerns for microscopic hematuria to warrant further evaluation.  She is agreeable this plan.  Return for UA drop off in 6 weeks.  Vanna Scotland, MD  [facility] [address], [address] [place], [place] [address] [phone]

## 2019-08-16 ENCOUNTER — Other Ambulatory Visit: Payer: Self-pay | Admitting: Obstetrics and Gynecology

## 2019-08-16 DIAGNOSIS — N6320 Unspecified lump in the left breast, unspecified quadrant: Secondary | ICD-10-CM

## 2019-08-18 ENCOUNTER — Ambulatory Visit
Admission: RE | Admit: 2019-08-18 | Discharge: 2019-08-18 | Disposition: A | Discharge status: Home / Self Care | Payer: BC Managed Care – PPO | Source: Ambulatory Visit | Attending: Obstetrics and Gynecology | Admitting: Obstetrics and Gynecology

## 2019-08-18 DIAGNOSIS — N6320 Unspecified lump in the left breast, unspecified quadrant: Secondary | ICD-10-CM | POA: Insufficient documentation

## 2019-09-01 ENCOUNTER — Other Ambulatory Visit: Payer: BC Managed Care – PPO

## 2019-09-06 ENCOUNTER — Other Ambulatory Visit: Payer: Self-pay

## 2019-09-06 ENCOUNTER — Other Ambulatory Visit: Payer: BC Managed Care – PPO

## 2019-09-06 DIAGNOSIS — N390 Urinary tract infection, site not specified: Secondary | ICD-10-CM

## 2019-09-07 LAB — URINALYSIS, COMPLETE
Bilirubin, UA: NEGATIVE
Glucose, UA: NEGATIVE
Ketones, UA: NEGATIVE
Leukocytes,UA: NEGATIVE
Nitrite, UA: NEGATIVE
Protein,UA: NEGATIVE
RBC, UA: NEGATIVE
Specific Gravity, UA: <1.005 — ABNORMAL LOW (ref 1.005–1.030)
Urobilinogen, Ur: 0.2 mg/dL (ref 0.2–1.0)
pH, UA: 5.5 (ref 5.0–7.5)

## 2019-09-07 LAB — MICROSCOPIC EXAMINATION: RBC, Urine: NONE SEEN /hpf (ref 0–2)

## 2019-09-09 LAB — CULTURE, URINE COMPREHENSIVE

## 2019-10-19 DIAGNOSIS — Z3490 Encounter for supervision of normal pregnancy, unspecified, unspecified trimester: Secondary | ICD-10-CM | POA: Insufficient documentation

## 2019-10-19 LAB — OB RESULTS CONSOLE VARICELLA ZOSTER ANTIBODY, IGG: Varicella: IMMUNE

## 2019-10-19 LAB — OB RESULTS CONSOLE RUBELLA ANTIBODY, IGM: Rubella: IMMUNE

## 2019-10-19 LAB — OB RESULTS CONSOLE HEPATITIS B SURFACE ANTIGEN: Hepatitis B Surface Ag: NEGATIVE

## 2020-04-16 LAB — OB RESULTS CONSOLE GC/CHLAMYDIA
Chlamydia: NEGATIVE
Gonorrhea: NEGATIVE

## 2020-04-16 LAB — OB RESULTS CONSOLE GBS: GBS: NEGATIVE

## 2020-04-16 LAB — OB RESULTS CONSOLE RPR: RPR: NONREACTIVE

## 2020-04-16 LAB — OB RESULTS CONSOLE HIV ANTIBODY (ROUTINE TESTING): HIV: NONREACTIVE

## 2020-05-15 ENCOUNTER — Other Ambulatory Visit: Payer: Self-pay

## 2020-05-15 ENCOUNTER — Inpatient Hospital Stay
Admission: EM | Admit: 2020-05-15 | Discharge: 2020-05-19 | DRG: 806 | Disposition: A | Discharge status: Home / Self Care | Payer: BC Managed Care – PPO | Attending: Obstetrics & Gynecology | Admitting: Obstetrics & Gynecology

## 2020-05-15 ENCOUNTER — Encounter: Payer: Self-pay | Admitting: Obstetrics and Gynecology

## 2020-05-15 DIAGNOSIS — D62 Acute posthemorrhagic anemia: Secondary | ICD-10-CM | POA: Diagnosis not present

## 2020-05-15 DIAGNOSIS — J45909 Unspecified asthma, uncomplicated: Secondary | ICD-10-CM | POA: Diagnosis present

## 2020-05-15 DIAGNOSIS — O99214 Obesity complicating childbirth: Secondary | ICD-10-CM | POA: Diagnosis present

## 2020-05-15 DIAGNOSIS — E669 Obesity, unspecified: Secondary | ICD-10-CM | POA: Diagnosis present

## 2020-05-15 DIAGNOSIS — O3663X0 Maternal care for excessive fetal growth, third trimester, not applicable or unspecified: Principal | ICD-10-CM | POA: Diagnosis present

## 2020-05-15 DIAGNOSIS — Z3A40 40 weeks gestation of pregnancy: Secondary | ICD-10-CM

## 2020-05-15 DIAGNOSIS — O26843 Uterine size-date discrepancy, third trimester: Secondary | ICD-10-CM | POA: Insufficient documentation

## 2020-05-15 DIAGNOSIS — O9952 Diseases of the respiratory system complicating childbirth: Secondary | ICD-10-CM | POA: Diagnosis present

## 2020-05-15 DIAGNOSIS — Z20822 Contact with and (suspected) exposure to covid-19: Secondary | ICD-10-CM | POA: Diagnosis present

## 2020-05-15 DIAGNOSIS — O9081 Anemia of the puerperium: Secondary | ICD-10-CM | POA: Diagnosis not present

## 2020-05-15 DIAGNOSIS — O2243 Hemorrhoids in pregnancy, third trimester: Secondary | ICD-10-CM | POA: Diagnosis present

## 2020-05-15 LAB — CBC
HCT: 41 % (ref 36.0–46.0)
Hemoglobin: 13.8 g/dL (ref 12.0–15.0)
MCH: 28.8 pg (ref 26.0–34.0)
MCHC: 33.7 g/dL (ref 30.0–36.0)
MCV: 85.6 fL (ref 80.0–100.0)
Platelets: 184 10*3/uL (ref 150–400)
RBC: 4.79 MIL/uL (ref 3.87–5.11)
RDW: 14.8 % (ref 11.5–15.5)
WBC: 16.6 10*3/uL — ABNORMAL HIGH (ref 4.0–10.5)
nRBC: 0 % (ref 0.0–0.2)

## 2020-05-15 LAB — RESPIRATORY PANEL BY RT PCR (FLU A&B, COVID)
Influenza A by PCR: NEGATIVE
Influenza B by PCR: NEGATIVE
SARS Coronavirus 2 by RT PCR: NEGATIVE

## 2020-05-15 MED ORDER — LACTATED RINGERS IV SOLN: INTRAVENOUS | Status: DC

## 2020-05-15 MED ORDER — OXYTOCIN-SODIUM CHLORIDE 30-0.9 UT/500ML-% IV SOLN
2.5000 [IU]/h | INTRAVENOUS | Status: DC
  Administered 2020-05-16: 2.5 [IU]/h via INTRAVENOUS
  Filled 2020-05-15: qty 1000

## 2020-05-15 MED ORDER — OXYTOCIN-SODIUM CHLORIDE 30-0.9 UT/500ML-% IV SOLN
1.0000 m[IU]/min | INTRAVENOUS | Status: DC
  Administered 2020-05-16: 4 m[IU]/min via INTRAVENOUS
  Administered 2020-05-16: 2 m[IU]/min via INTRAVENOUS
  Filled 2020-05-15: qty 500

## 2020-05-15 MED ORDER — SODIUM CHLORIDE 0.9 % IV SOLN: 250.0000 mL | INTRAVENOUS | Status: DC | PRN

## 2020-05-15 MED ORDER — ONDANSETRON HCL 4 MG/2ML IJ SOLN
4.0000 mg | Freq: Four times a day (QID) | INTRAMUSCULAR | Status: DC | PRN
  Administered 2020-05-16 (×2): 4 mg via INTRAVENOUS
  Filled 2020-05-15 (×2): qty 2

## 2020-05-15 MED ORDER — LACTATED RINGERS IV SOLN
500.0000 mL | INTRAVENOUS | Status: DC | PRN
  Administered 2020-05-16 – 2020-05-17 (×2): 500 mL via INTRAVENOUS

## 2020-05-15 MED ORDER — MISOPROSTOL 200 MCG PO TABS
ORAL_TABLET | ORAL | Status: AC
  Filled 2020-05-15: qty 4

## 2020-05-15 MED ORDER — LIDOCAINE HCL (PF) 1 % IJ SOLN
INTRAMUSCULAR | Status: AC
  Filled 2020-05-15: qty 30

## 2020-05-15 MED ORDER — AMMONIA AROMATIC IN INHA
RESPIRATORY_TRACT | Status: AC
  Filled 2020-05-15: qty 10

## 2020-05-15 MED ORDER — SODIUM CHLORIDE 0.9% FLUSH: 3.0000 mL | Freq: Two times a day (BID) | INTRAVENOUS | Status: DC

## 2020-05-15 MED ORDER — CALCIUM CARBONATE ANTACID 500 MG PO CHEW: 400.0000 mg | CHEWABLE_TABLET | Freq: Three times a day (TID) | ORAL | Status: DC | PRN

## 2020-05-15 MED ORDER — BUTORPHANOL TARTRATE 1 MG/ML IJ SOLN: 1.0000 mg | INTRAMUSCULAR | Status: DC | PRN

## 2020-05-15 MED ORDER — LIDOCAINE HCL (PF) 1 % IJ SOLN: 30.0000 mL | INTRAMUSCULAR | Status: DC | PRN

## 2020-05-15 MED ORDER — ACETAMINOPHEN 500 MG PO TABS: 1000.0000 mg | ORAL_TABLET | Freq: Four times a day (QID) | ORAL | Status: DC | PRN

## 2020-05-15 MED ORDER — MISOPROSTOL 25 MCG QUARTER TABLET
25.0000 ug | ORAL_TABLET | ORAL | Status: DC | PRN
  Filled 2020-05-15: qty 1

## 2020-05-15 MED ORDER — MISOPROSTOL 25 MCG QUARTER TABLET
25.0000 ug | ORAL_TABLET | ORAL | Status: DC | PRN
  Administered 2020-05-15: 25 ug via VAGINAL
  Filled 2020-05-15 (×2): qty 1

## 2020-05-15 MED ORDER — OXYTOCIN 10 UNIT/ML IJ SOLN
INTRAMUSCULAR | Status: AC
  Filled 2020-05-15: qty 2

## 2020-05-15 MED ORDER — OXYTOCIN BOLUS FROM INFUSION
333.0000 mL | Freq: Once | INTRAVENOUS | Status: AC
  Administered 2020-05-16: 333 mL via INTRAVENOUS

## 2020-05-15 MED ORDER — TERBUTALINE SULFATE 1 MG/ML IJ SOLN: 0.2500 mg | Freq: Once | INTRAMUSCULAR | Status: DC | PRN

## 2020-05-15 MED ORDER — SODIUM CHLORIDE 0.9% FLUSH: 3.0000 mL | INTRAVENOUS | Status: DC | PRN

## 2020-05-15 NOTE — Progress Notes (Signed)
G2P0010 at [redacted]w[redacted]d, LMP of 08/08/2019, c/w early [place] at [redacted]w[redacted]d.  Scheduled for induction of labor for macrosomia on 05/15/2020.   Prenatal provider: [facility] Pregnancy complicated by: 1. Macrosomia 2. Obesity in pregnancy  3. Asthma  4. Elevated 1hr GTT - 3hr WNL  Prenatal Labs: Blood type/Rh O pos   Antibody screen neg  Rubella Immune  Varicella Immune  RPR NR  HBsAg Neg  HIV NR  GC neg  Chlamydia neg  Genetic screening negative  1 hour GTT 158  3 hour GTT 98, 161, 140, 137  GBS Negative    Tdap: given 02/23/2020 Flu: Given 03/26/2020 Contraception: TBD Feeding preference: Breast   ____ Margaretmary Eddy, CNM Certified Nurse Midwife [facility]

## 2020-05-16 ENCOUNTER — Encounter: Payer: Self-pay | Admitting: Obstetrics and Gynecology

## 2020-05-16 ENCOUNTER — Inpatient Hospital Stay: Payer: BC Managed Care – PPO | Admitting: Registered Nurse

## 2020-05-16 LAB — TYPE AND SCREEN
ABO/RH(D): O POS
Antibody Screen: NEGATIVE

## 2020-05-16 LAB — RPR: RPR Ser Ql: NONREACTIVE

## 2020-05-16 MED ORDER — BUPIVACAINE HCL (PF) 0.25 % IJ SOLN
INTRAMUSCULAR | Status: DC | PRN
  Administered 2020-05-16 (×2): 4 mL via EPIDURAL

## 2020-05-16 MED ORDER — PHENYLEPHRINE 40 MCG/ML (10ML) SYRINGE FOR IV PUSH (FOR BLOOD PRESSURE SUPPORT)
80.0000 ug | PREFILLED_SYRINGE | INTRAVENOUS | Status: DC | PRN
  Filled 2020-05-16: qty 10

## 2020-05-16 MED ORDER — EPHEDRINE 5 MG/ML INJ
10.0000 mg | INTRAVENOUS | Status: DC | PRN
  Filled 2020-05-16: qty 2

## 2020-05-16 MED ORDER — FENTANYL 2.5 MCG/ML W/ROPIVACAINE 0.15% IN NS 100 ML EPIDURAL (ARMC)
12.0000 mL/h | EPIDURAL | Status: DC
  Administered 2020-05-16: 12 mL/h via EPIDURAL

## 2020-05-16 MED ORDER — FENTANYL 2.5 MCG/ML W/ROPIVACAINE 0.15% IN NS 100 ML EPIDURAL (ARMC)
EPIDURAL | Status: AC
  Filled 2020-05-16: qty 100

## 2020-05-16 MED ORDER — FAMOTIDINE 20 MG PO TABS
20.0000 mg | ORAL_TABLET | Freq: Every day | ORAL | Status: DC
  Administered 2020-05-17 – 2020-05-19 (×5): 20 mg via ORAL
  Filled 2020-05-16 (×4): qty 1

## 2020-05-16 MED ORDER — LIDOCAINE HCL (PF) 1 % IJ SOLN
INTRAMUSCULAR | Status: DC | PRN
  Administered 2020-05-16: 3 mL

## 2020-05-16 MED ORDER — FLUTICASONE PROPIONATE 50 MCG/ACT NA SUSP
1.0000 | Freq: Every day | NASAL | Status: DC
  Administered 2020-05-17: 1 via NASAL
  Filled 2020-05-16: qty 16

## 2020-05-16 MED ORDER — LACTATED RINGERS IV SOLN: 500.0000 mL | Freq: Once | INTRAVENOUS | Status: AC

## 2020-05-16 MED ORDER — DIPHENHYDRAMINE HCL 50 MG/ML IJ SOLN: 12.5000 mg | INTRAMUSCULAR | Status: DC | PRN

## 2020-05-16 MED ORDER — METHYLERGONOVINE MALEATE 0.2 MG/ML IJ SOLN
INTRAMUSCULAR | Status: AC
  Filled 2020-05-16: qty 1

## 2020-05-16 MED ORDER — LIDOCAINE-EPINEPHRINE (PF) 1.5 %-1:200000 IJ SOLN
INTRAMUSCULAR | Status: DC | PRN
  Administered 2020-05-16: 3 mL via PERINEURAL

## 2020-05-16 MED ORDER — MONTELUKAST SODIUM 10 MG PO TABS
10.0000 mg | ORAL_TABLET | Freq: Every day | ORAL | Status: DC
  Administered 2020-05-17: 10 mg via ORAL
  Filled 2020-05-16: qty 1

## 2020-05-16 NOTE — Discharge Summary (Addendum)
Obstetrical Discharge Summary  Patient Name: Brandy Coleman DOB: August 08, 1990 MRN: [id number]  Date of Admission: 05/15/2020 Date of Delivery: 05/16/2020 Delivered by: Brandy Plumber, MD Date of Discharge: 05/19/2020  [facility] EHM:[id number] last menstrual period was 08/08/2019. EDC Estimated Date of Delivery: 05/14/20 Gestational Age at Delivery: [redacted]w[redacted]d   Antepartum complications:  Pregnancy Issues: 1. Macrosomia 2. Obesity in pregnancy  3. Asthma  4. Elevated 1hr GTT - 3hr WNL  Admitting Diagnosis: induction of labor for past due date, suspected fetal macrosomia Secondary Diagnosis: Patient Active Problem List   Diagnosis Date Noted  . Uterine size date discrepancy, third trimester 05/15/2020  . Indication for care in labor or delivery 05/15/2020  . Supervision of normal pregnancy 10/19/2019  . Gastroesophageal reflux disease without esophagitis 06/22/2018  . Reactive airway disease 06/22/2018    Augmentation: AROM, Pitocin and Cytotec Complications: Hemorrhage>1042mL Intrapartum complications/course: presented to L&D with IOL for past due date, and suspected macrosomia.  She was given cytotec, augmented with pitocin. AROM'd. epidual placed. Progressed to complete, second stage:  1.5hrs, with delivery of fetal head with restitution to LOT. Nuchal cord was tight and not able to be reduced at the perineum.  Shoulder dystocia encountered.  McRoberts and woods corkscrew performed with right anterior shoulder then delivered, and remainder of newborn delivered without difficulty.  Nuchal reduced immediately upon delivery, and baby was floppy.  Cord immediately cut and pediatric team brought baby to the warmer for evaluation and intervention.  A segment of cord was portioned off and cord gas was collected. Cord blood also collected for typing. Placenta spontaneously delivered, intact, and IV pitocin given for hemorrhage prophylaxis.  There were two deep and long bilateral  vaginal tears.  Each were 9cm long and 2cm deep.  They were each closed with running locking stitches and intermittent interrupted sutures of 3-0 vicryl.  The 2nd degree perineal was closed with standard crown and subcuticular stitches. Of note, the majority of the blood loss was from the vaginal lacerations, and not from uterine atony. Her uterus remained firm post-delivery with minimal blood expressed with massage.  No uterotonics were necessary. Delivery Type: spontaneous vaginal delivery Anesthesia: epidural Placenta: spontaneous Laceration: bilateral vaginal, 2nd degree perineal. Episiotomy: none Newborn Data: Live born female  Birth Weight: 10 lb 0.1 oz (4540 g) APGAR: 2, 9  Newborn Delivery   Time head delivered: 05/16/2020 21:23:00 Birth date/time: 05/16/2020 21:23:00 Delivery type: Vaginal, Spontaneous      Postpartum Procedures: IV iron infusion on PPD2 and delayed infusion reaction requiring IV steroid and continued monitoring overnight.   EdinburghInocente Coleman Postnatal Depression Scale Screening Tool 05/17/2020  I have been able to laugh and see the funny side of things. 0  I have looked forward with enjoyment to things. 0  I have blamed myself unnecessarily when things went wrong. 0  I have been anxious or worried for no good reason. 2  I have felt scared or panicky for no good reason. 1  Things have been getting on top of me. 1  I have been so unhappy that I have had difficulty sleeping. 0  I have felt sad or miserable. 1  I have been so unhappy that I have been crying. 0  The thought of harming myself has occurred to me. 0  Edinburgh Postnatal Depression Scale Total 5     Post partum course:  Patient had an uncomplicated postpartum course.  By time of discharge on PPD#3, her pain was controlled on  oral pain medications; she had appropriate lochia and was ambulating, voiding without difficulty and tolerating regular diet.  She was deemed stable for discharge to home.       Discharge Physical Exam:  BP 96/68 (BP Location: Left Arm)   Pulse 81   Temp 98.3 F (36.8 C) (Oral)   Resp 18   Ht 5\' 6"  (1.676 m)   Wt 105.2 kg   LMP 08/08/2019   SpO2 99%   Breastfeeding Unknown   BMI 37.45 kg/m   General: NAD CV: RRR Pulm: CTABL, nl effort ABD: s/nd/nt, fundus firm and below the umbilicus Lochia: moderate DVT Evaluation: LE non-ttp, no evidence of DVT on exam.  Hemoglobin  Date Value Ref Range Status  05/18/2020 8.9 (L) 12.0 - 15.0 g/dL Final   HCT  Date Value Ref Range Status  05/18/2020 27.8 (L) 36 - 46 % Final     Disposition: stable, discharge to home. Baby Feeding: breastmilk and formula Baby Disposition: home with mom  Rh Immune globulin given: n/a Rubella vaccine given: n/a Flu vaccine given in AP or PP setting: AP 03/26/20 Tdap given 02/23/20  Contraception: POPs  Prenatal Labs:  Blood type/Rh O pos  Antibody screen neg  Rubella Immune  Varicella Immune  RPR NR  HBsAg Neg  HIV NR  GC neg  Chlamydia neg  Genetic screening negative  1 hour GTT 158  3 hour GTT 98, 161, 140, 137  GBS Negative       Plan:  Brandy Coleman was discharged to home in good condition. Follow-up appointment with delivering provider in 6 weeks.  Discharge Medications: Allergies as of 05/19/2020      Reactions   Venofer [iron Sucrose] Other (See Comments)   Rigors, fever      Medication List    STOP taking these medications   ciprofloxacin 500 MG tablet Commonly known as: CIPRO     TAKE these medications   acetaminophen 500 MG tablet Commonly known as: TYLENOL Take 2 tablets (1,000 mg total) by mouth every 6 (six) hours as needed (for pain scale < 4).   albuterol 108 (90 Base) MCG/ACT inhaler Commonly known as: VENTOLIN HFA 2 puffs q.i.d. p.r.n. short of breath, wheezing, or cough   docusate sodium 100 MG capsule Commonly known as: COLACE Take 1 capsule (100 mg total) by mouth 2 (two) times daily.   famotidine 20 MG  tablet Commonly known as: PEPCID Take 1 tablet (20 mg total) by mouth daily. Start taking on: May 20, 2020   ferrous sulfate 325 (65 FE) MG tablet Take 1 tablet (325 mg total) by mouth 2 (two) times daily with a meal.   fluticasone 50 MCG/ACT nasal spray Commonly known as: FLONASE Place into both nostrils daily.   ibuprofen 600 MG tablet Commonly known as: ADVIL Take 1 tablet (600 mg total) by mouth every 6 (six) hours.   montelukast 10 MG tablet Commonly known as: SINGULAIR Take 10 mg by mouth daily.   Prenatal (w/Iron & FA) 27-0.8 MG Tabs Take 1 tablet by mouth daily.        Follow-up Information    Ward, May 22, 2020, MD. Schedule an appointment as soon as possible for a visit in 6 week(s).   Specialty: Obstetrics and Gynecology Contact information: [address] [place] [phone]               Signed: [phone], CNM  05/19/2020 11:31 AM

## 2020-05-16 NOTE — Anesthesia Procedure Notes (Signed)
Epidural Patient location during procedure: OB Start time: 05/16/2020 3:38 PM End time: 05/16/2020 3:42 PM  Staffing Anesthesiologist: Corinda Gubler, MD Resident/CRNA: Stormy Fabian, CRNA Performed: resident/CRNA   Preanesthetic Checklist Completed: patient identified, IV checked, site marked, risks and benefits discussed, surgical consent, monitors and equipment checked, pre-op evaluation and timeout performed  Epidural Patient position: sitting Prep: ChloraPrep Patient monitoring: heart rate, continuous pulse ox and blood pressure Approach: midline Location: L3-L4 Injection technique: LOR saline  Needle:  Needle type: Tuohy  Needle gauge: 17 G Needle length: 9 cm and 9 Needle insertion depth: 7 and 12 cm Catheter type: closed end flexible Catheter size: 19 Gauge Test dose: negative and 1.5% lidocaine with Epi 1:200 K  Assessment Sensory level: T10 Events: blood not aspirated, injection not painful, no injection resistance, no paresthesia and negative IV test  Additional Notes 1 attempt Pt. Evaluated and documentation done after procedure finished. Patient identified. Risks/Benefits/Options discussed with patient including but not limited to bleeding, infection, nerve damage, paralysis, failed block, incomplete pain control, headache, blood pressure changes, nausea, vomiting, reactions to medication both or allergic, itching and postpartum back pain. Confirmed with bedside nurse the patient's most recent platelet count. Confirmed with patient that they are not currently taking any anticoagulation, have any bleeding history or any family history of bleeding disorders. Patient expressed understanding and wished to proceed. All questions were answered. Sterile technique was used throughout the entire procedure. Please see nursing notes for vital signs. Test dose was given through epidural catheter and negative prior to continuing to dose epidural or start infusion. Warning signs of  high block given to the patient including shortness of breath, tingling/numbness in hands, complete motor block, or any concerning symptoms with instructions to call for help. Patient was given instructions on fall risk and not to get out of bed. All questions and concerns addressed with instructions to call with any issues or inadequate analgesia.   Patient tolerated the insertion well without immediate complications.Reason for block:procedure for pain

## 2020-05-16 NOTE — Progress Notes (Signed)
Labor Progress Note  Brandy Coleman is a 29 y.o. G2P0010 at [redacted]w[redacted]d by LMP admitted for induction of labor due to macrosomia.  Subjective: feeling mild cramping   Objective: BP 124/88 (BP Location: Left Arm)   Pulse (!) 109   Temp 98 F (36.7 C) (Oral)   Resp 16   Ht 5\' 6"  (1.676 m)   Wt 105.2 kg   LMP 08/08/2019   SpO2 99%   BMI 37.45 kg/m  Notable VS details: reviewed   Fetal Assessment: FHT:  FHR: 150 bpm, variability: moderate,  accelerations:  Present,  decelerations:  Absent Category/reactivity:  Category I UC:   regular, every 2-3 minutes, mild to moderate to palpation  SVE:   4/60/-1 to 0/soft/ant Membrane status: AROM at 1323 Amniotic color: Clear  Labs: Lab Results  Component Value Date   WBC 16.6 (H) 05/15/2020   HGB 13.8 05/15/2020   HCT 41.0 05/15/2020   MCV 85.6 05/15/2020   PLT 184 05/15/2020    Assessment / Plan: Induction of labor d/t macrosomia, s/p 1 dose of buccal and vaginal cytotec.  Early labor. AROM performed, will hold pitocin at 12 milliunits/min for an hour after AROM and then titrate for adequate labor and maternal/fetal tolerance.   Labor: Progressing normally Preeclampsia:  no s/s Fetal Wellbeing:  Category I Pain Control:  Labor support without medications I/D:  n/a  13/08/2019, CNM 05/16/2020, 1:35 PM

## 2020-05-16 NOTE — Progress Notes (Signed)
Labor Progress Note  Brandy Coleman is a 29 y.o. G2P0010 at [redacted]w[redacted]d by LMP admitted for induction of labor due to macrosomia.  Subjective: feeling comfortable with epidural  Objective: BP 123/77   Pulse (!) 109   Temp 98.3 F (36.8 C) (Oral)   Resp 20   Ht 5\' 6"  (1.676 m)   Wt 105.2 kg   LMP 08/08/2019   SpO2 100%   BMI 37.45 kg/m  Notable VS details: reviewed   Fetal Assessment: FHT:  FHR: 140 bpm, variability: moderate,  accelerations:  Present,  decelerations:  Absent Category/reactivity:  Category I UC:   regular, every 2-3 minutes SVE:   8/90/+1, + bloody show  Membrane status: AROM at 1323 Amniotic color: Clear   Labs: Lab Results  Component Value Date   WBC 16.6 (H) 05/15/2020   HGB 13.8 05/15/2020   HCT 41.0 05/15/2020   MCV 85.6 05/15/2020   PLT 184 05/15/2020    Assessment / Plan: Induction of labor due to macrosomia,  progressing well on pitocin  -Dr. 13/08/2019 updated on progress  Labor: Progressing normally Preeclampsia:  no s/s Fetal Wellbeing:  Category I Pain Control:  Epidural I/D:  n/a  Elesa Massed, CNM 05/16/2020, 6:45 PM

## 2020-05-16 NOTE — Lactation Note (Signed)
Lactation Consultation Note  Patient Name: Brandy Coleman [id number] Date: 05/16/2020    Patient requested Lactation visit prior to delivery. Pt had questions re: pump use, flange fit, onset of pumping, and general BF education. LC provided early breastfeeding education: skin to skin post delivery, early feeding cues, achieving optimal position/latch, signs of a good latch vs poor latch, transfer, stomach size, newborn feeding behaviors/patterns in first 24 hours. Encouraged opportunity for baby to establish milk supply by putting baby to breast on demand, delaying the introduction of pumping. Discussed recommended time frame for pump and bottle introduction pending healthy baby.  LC reviewed milk supply and demand, normal course of lactation, use of coconut oil and hydrogels for nipple soreness, warm compresses and ice pack for management of breast fullness/engorgement, and other care strategies and tips.  Pt felt her questions were addressed; no other questions at this time.   Maternal Data    Feeding    LATCH Score                   Interventions    Lactation Tools Discussed/Used     Consult Status      [name] 05/16/2020, 11:02 AM

## 2020-05-16 NOTE — H&P (Signed)
OB History & Physical   History of Present Illness:  Chief Complaint:   HPI:  Brandy Coleman is a 29 y.o. G2P0010 female at [redacted]w[redacted]d dated by LMP of 08/08/2019, c/w [place] at [redacted]w[redacted]d.  She presents to L&D for elective IOL for macrosomia.   Reports active fetal movement  Contractions: irregular cramping  LOF/SROM: denies  Vaginal bleeding: denies   Pregnancy Issues: 1. Macrosomia 2. Obesity in pregnancy  3. Asthma  4. Elevated 1hr GTT - 3hr WNL  Patient Active Problem List   Diagnosis Date Noted  . Uterine size date discrepancy, third trimester 05/15/2020  . Indication for care in labor or delivery 05/15/2020  . Supervision of normal pregnancy 10/19/2019  . Gastroesophageal reflux disease without esophagitis 06/22/2018  . Reactive airway disease 06/22/2018      Maternal Medical History:   Past Medical History:  Diagnosis Date  . Allergies   . Asthma   . GERD (gastroesophageal reflux disease)     Past Surgical History:  Procedure Laterality Date  . DIAGNOSTIC LAPAROSCOPY WITH REMOVAL OF ECTOPIC PREGNANCY Left 04/12/2019   Procedure: LAPAROSCOPY WITH REMOVAL OF ECTOPIC PREGNANCY;  Surgeon: Christeen Douglas, MD;  Location: [facility] ORS;  Service: Gynecology;  Laterality: Left;  . LAPAROSCOPIC UNILATERAL SALPINGECTOMY Left 04/12/2019   Procedure: LAPAROSCOPIC LEFT SALPINGECTOMY;  Surgeon: Christeen Douglas, MD;  Location: [facility] ORS;  Service: Gynecology;  Laterality: Left;  . TONSILLECTOMY    . WISDOM TOOTH EXTRACTION      No Known Allergies  Prior to Admission medications   Medication Sig Start Date End Date Taking? Authorizing Provider  albuterol (PROVENTIL HFA;VENTOLIN HFA) 108 (90 Base) MCG/ACT inhaler 2 puffs q.i.d. p.r.n. short of breath, wheezing, or cough 06/22/18  Yes [provider]  fluticasone (FLONASE) 50 MCG/ACT nasal spray Place into both nostrils daily.   Yes [provider]  montelukast (SINGULAIR) 10 MG tablet Take 10 mg by mouth daily. 06/22/18  Yes  [provider]  Prenatal Multivit-Min-Fe-FA (PRENATAL, W/IRON & FA,) 27-0.8 MG TABS Take 1 tablet by mouth daily.   Yes [provider]  ciprofloxacin (CIPRO) 500 MG tablet Take 500 mg by mouth 2 (two) times daily. Patient not taking: Reported on 05/15/2020 07/16/19   [provider]     Prenatal care site:  [facility]  Social History: She  reports that she has never smoked. She has never used smokeless tobacco. She reports current alcohol use. She reports that she does not use drugs.  Family History: family history is not on file.   Review of Systems: A full review of systems was performed and negative except as noted in the HPI.     Physical Exam:  Vital Signs: BP 126/78 (BP Location: Left Arm)   Pulse 99   Temp 98.1 F (36.7 C) (Oral)   Resp 16   Ht 5\' 6"  (1.676 m)   Wt 105.2 kg   LMP 08/08/2019   SpO2 99%   BMI 37.45 kg/m  Physical Exam  General: no acute distress.  HEENT: normocephalic, atraumatic Heart: regular rate & rhythm.  No murmurs/rubs/gallops Lungs: clear to auscultation bilaterally, normal respiratory effort Abdomen: soft, gravid, non-tender;  EFW: 9lbs  Pelvic:   External: Normal external female genitalia  Cervix: Dilation: 4 / Effacement (%): 60, 70 / Station: -1    Extremities: non-tender, symmetric, Mild, dependent edema bilaterally.  DTRs: 2+/2+  Neurologic: Alert & oriented x 3.    Results for orders placed or performed during the hospital encounter  of 05/15/20 (from the past 24 hour(s))  Respiratory Panel by RT PCR (Flu A&B, Covid) - Nasopharyngeal Swab     Status: None   Collection Time: 05/15/20  8:20 PM   Specimen: Nasopharyngeal Swab  Result Value Ref Range   SARS Coronavirus 2 by RT PCR NEGATIVE NEGATIVE   Influenza A by PCR NEGATIVE NEGATIVE   Influenza B by PCR NEGATIVE NEGATIVE  CBC     Status: Abnormal   Collection Time: 05/15/20  8:20 PM  Result Value Ref Range   WBC 16.6 (H) 4.0 - 10.5 K/uL    RBC 4.79 3.87 - 5.11 MIL/uL   Hemoglobin 13.8 12.0 - 15.0 g/dL   HCT 48.2 36 - 46 %   MCV 85.6 80.0 - 100.0 fL   MCH 28.8 26.0 - 34.0 pg   MCHC 33.7 30.0 - 36.0 g/dL   RDW 50.0 37.0 - 48.8 %   Platelets 184 150 - 400 K/uL   nRBC 0.0 0.0 - 0.2 %  Type and screen     Status: None   Collection Time: 05/15/20  8:20 PM  Result Value Ref Range   ABO/RH(D) O POS    Antibody Screen NEG    Sample Expiration      05/18/2020,2359 Performed at [facility], [address] [place], [place] [address]     Pertinent Results:  Prenatal Labs: Blood type/Rh O pos  Antibody screen neg  Rubella Immune  Varicella Immune  RPR NR  HBsAg Neg  HIV NR  GC neg  Chlamydia neg  Genetic screening negative  1 hour GTT 158  3 hour GTT 98, 161, 140, 137  GBS Negative    FHT: Baseline: 145 bpm, Variability: moderate, Accelerations: present and Decelerations: Absent TOCO: irregular, every 1-5 minutes SVE:  Dilation: 4 / Effacement (%): 60, 70 / Station: -1    Cephalic by Leopolds and SVE  -US performed in office prior to admission - cephalic presentation confirmed   No results found.  Assessment:  Brandy Coleman is a 29 y.o. G91P0010 female at [redacted]w[redacted]d with elective IOL for macrosomia.   Plan:  1. Admit to Labor & Delivery; consents reviewed and obtained - Covid admission screen  - Dr. Elesa Massed notified of admission and plan to start oxytocin   2. Fetal Well being  - Fetal Tracing: cat 1 - Group B Streptococcus ppx not indicated: GBS neg - Presentation: cephalic confirmed by SVE and [place] in office    3. Routine OB: - Prenatal labs reviewed, as above - Rh pos - CBC, T&S, RPR on admit - Reg diet, saline lock  4. Monitoring of labor  -  Contractions monitored with external toco -  Pelvis adequate for trial of labor  -  Induction initatiated with misoprostol, s/p 1 dose of buccal and vaginal misoprostol -  Will start oxytocin for continued active management of induction of labor  -  AROM  when appropriate  -  Plan for  continuous fetal monitoring -  Maternal pain control as desired - Anticipate vaginal delivery  5. Post Partum Planning: - Infant feeding: breast - Contraception: TBD - Tdap vaccine: given 02/23/2020 - Flu vaccine: given 03/26/2020  Gustavo Lah, CNM 05/16/20 8:37 AM  Margaretmary Eddy, CNM Certified Nurse Midwife [facility]

## 2020-05-16 NOTE — Anesthesia Preprocedure Evaluation (Signed)
Anesthesia Evaluation  Patient identified by MRN, date of birth, ID band Patient awake    Reviewed: Allergy & Precautions, H&P , NPO status , Patient's Chart, lab work & pertinent test results  History of Anesthesia Complications Negative for: history of anesthetic complications  Airway Mallampati: II  TM Distance: >3 FB Neck ROM: full    Dental no notable dental hx.    Pulmonary asthma ,    Pulmonary exam normal        Cardiovascular negative cardio ROS Normal cardiovascular exam     Neuro/Psych negative neurological ROS  negative psych ROS   GI/Hepatic Neg liver ROS, GERD  ,  Endo/Other  negative endocrine ROS  Renal/GU negative Renal ROS  negative genitourinary   Musculoskeletal   Abdominal   Peds  Hematology negative hematology ROS (+)   Anesthesia Other Findings   Reproductive/Obstetrics (+) Pregnancy                             Anesthesia Physical Anesthesia Plan  ASA: II  Anesthesia Plan: Epidural   Post-op Pain Management:    Induction:   PONV Risk Score and Plan:   Airway Management Planned:   Additional Equipment:   Intra-op Plan:   Post-operative Plan:   Informed Consent: I have reviewed the patients History and Physical, chart, labs and discussed the procedure including the risks, benefits and alternatives for the proposed anesthesia with the patient or authorized representative who has indicated his/her understanding and acceptance.       Plan Discussed with: Anesthesiologist  Anesthesia Plan Comments:         Anesthesia Quick Evaluation

## 2020-05-17 LAB — CBC
HCT: 30.1 % — ABNORMAL LOW (ref 36.0–46.0)
Hemoglobin: 10.3 g/dL — ABNORMAL LOW (ref 12.0–15.0)
MCH: 29.7 pg (ref 26.0–34.0)
MCHC: 34.2 g/dL (ref 30.0–36.0)
MCV: 86.7 fL (ref 80.0–100.0)
Platelets: 180 10*3/uL (ref 150–400)
RBC: 3.47 MIL/uL — ABNORMAL LOW (ref 3.87–5.11)
RDW: 15.1 % (ref 11.5–15.5)
WBC: 24.9 10*3/uL — ABNORMAL HIGH (ref 4.0–10.5)
nRBC: 0 % (ref 0.0–0.2)

## 2020-05-17 MED ORDER — IBUPROFEN 600 MG PO TABS
600.0000 mg | ORAL_TABLET | Freq: Four times a day (QID) | ORAL | Status: DC
  Administered 2020-05-17 – 2020-05-19 (×9): 600 mg via ORAL
  Filled 2020-05-17 (×9): qty 1

## 2020-05-17 MED ORDER — OXYCODONE HCL 5 MG PO TABS: 5.0000 mg | ORAL_TABLET | ORAL | Status: DC | PRN

## 2020-05-17 MED ORDER — WITCH HAZEL-GLYCERIN EX PADS
1.0000 "application " | MEDICATED_PAD | CUTANEOUS | Status: DC
  Administered 2020-05-17: 1 via TOPICAL
  Filled 2020-05-17 (×3): qty 100

## 2020-05-17 MED ORDER — DIBUCAINE (PERIANAL) 1 % EX OINT
1.0000 "application " | TOPICAL_OINTMENT | CUTANEOUS | Status: DC | PRN
  Administered 2020-05-17: 1 via RECTAL
  Filled 2020-05-17 (×3): qty 28

## 2020-05-17 MED ORDER — BENZOCAINE-MENTHOL 20-0.5 % EX AERO
1.0000 "application " | INHALATION_SPRAY | CUTANEOUS | Status: DC | PRN
  Administered 2020-05-17: 1 via TOPICAL
  Filled 2020-05-17 (×3): qty 56

## 2020-05-17 MED ORDER — SIMETHICONE 80 MG PO CHEW: 80.0000 mg | CHEWABLE_TABLET | ORAL | Status: DC | PRN

## 2020-05-17 MED ORDER — COCONUT OIL OIL
1.0000 "application " | TOPICAL_OIL | Status: DC | PRN
  Filled 2020-05-17: qty 120

## 2020-05-17 MED ORDER — FERROUS SULFATE 325 (65 FE) MG PO TABS
325.0000 mg | ORAL_TABLET | Freq: Two times a day (BID) | ORAL | Status: DC
  Administered 2020-05-17 – 2020-05-19 (×3): 325 mg via ORAL
  Filled 2020-05-17 (×3): qty 1

## 2020-05-17 MED ORDER — HYDROCORTISONE (PERIANAL) 2.5 % EX CREA
TOPICAL_CREAM | CUTANEOUS | Status: DC
  Filled 2020-05-17 (×2): qty 28.35

## 2020-05-17 MED ORDER — ONDANSETRON HCL 4 MG PO TABS: 4.0000 mg | ORAL_TABLET | ORAL | Status: DC | PRN

## 2020-05-17 MED ORDER — DIPHENHYDRAMINE HCL 25 MG PO CAPS: 25.0000 mg | ORAL_CAPSULE | Freq: Four times a day (QID) | ORAL | Status: DC | PRN

## 2020-05-17 MED ORDER — ONDANSETRON HCL 4 MG/2ML IJ SOLN: 4.0000 mg | INTRAMUSCULAR | Status: DC | PRN

## 2020-05-17 MED ORDER — PRENATAL MULTIVITAMIN CH
1.0000 | ORAL_TABLET | Freq: Every day | ORAL | Status: DC
  Administered 2020-05-17 – 2020-05-18 (×2): 1 via ORAL
  Filled 2020-05-17 (×2): qty 1

## 2020-05-17 MED ORDER — MONTELUKAST SODIUM 10 MG PO TABS
10.0000 mg | ORAL_TABLET | Freq: Every day | ORAL | Status: DC
  Administered 2020-05-17 – 2020-05-19 (×3): 10 mg via ORAL
  Filled 2020-05-17 (×4): qty 1

## 2020-05-17 MED ORDER — IBUPROFEN 600 MG PO TABS
600.0000 mg | ORAL_TABLET | Freq: Four times a day (QID) | ORAL | Status: DC
  Administered 2020-05-17: 600 mg via ORAL
  Filled 2020-05-17: qty 1

## 2020-05-17 MED ORDER — OXYCODONE HCL 5 MG PO TABS: 10.0000 mg | ORAL_TABLET | ORAL | Status: DC | PRN

## 2020-05-17 MED ORDER — ACETAMINOPHEN 500 MG PO TABS
1000.0000 mg | ORAL_TABLET | Freq: Four times a day (QID) | ORAL | Status: DC | PRN
  Administered 2020-05-17 – 2020-05-19 (×4): 1000 mg via ORAL
  Filled 2020-05-17 (×4): qty 2

## 2020-05-17 MED ORDER — DOCUSATE SODIUM 100 MG PO CAPS
100.0000 mg | ORAL_CAPSULE | Freq: Two times a day (BID) | ORAL | Status: DC
  Administered 2020-05-17 – 2020-05-19 (×4): 100 mg via ORAL
  Filled 2020-05-17 (×4): qty 1

## 2020-05-17 MED ORDER — WITCH HAZEL-GLYCERIN EX PADS
MEDICATED_PAD | CUTANEOUS | Status: AC
  Filled 2020-05-17: qty 100

## 2020-05-17 NOTE — Progress Notes (Signed)
Post Partum Day 1  Subjective: Doing well, no concerns. Ambulating without difficulty, pain managed with PO meds, tolerating regular diet, and voiding without difficulty.   No fever/chills, chest pain, shortness of breath, nausea/vomiting, or leg pain. No nipple or breast pain.   Objective: BP 99/69 (BP Location: Right Arm)   Pulse (!) 104   Temp 98.1 F (36.7 C) (Oral)   Resp 17   Ht 5\' 6"  (1.676 m)   Wt 105.2 kg   LMP 08/08/2019   SpO2 100%   Breastfeeding Unknown   BMI 37.45 kg/m    Physical Exam:  General: alert and cooperative Breasts: soft/nontender CV: RRR Pulm: nl effort Abdomen: soft, non-tender Uterine Fundus: firm Incision: healing well Perineum: minimal edema, repair well approximated Lochia: appropriate DVT Evaluation: No evidence of DVT seen on physical exam. Edinburgh:  Edinburgh Postnatal Depression Scale Screening Tool 05/17/2020  I have been able to laugh and see the funny side of things. 0  I have looked forward with enjoyment to things. 0  I have blamed myself unnecessarily when things went wrong. 0  I have been anxious or worried for no good reason. 2  I have felt scared or panicky for no good reason. 1  Things have been getting on top of me. 1  I have been so unhappy that I have had difficulty sleeping. 0  I have felt sad or miserable. 1  I have been so unhappy that I have been crying. 0  The thought of harming myself has occurred to me. 0  Edinburgh Postnatal Depression Scale Total 5     Recent Labs    05/15/20 2020 05/17/20 0620  HGB 13.8 10.3*  HCT 41.0 30.1*  WBC 16.6* 24.9*  PLT 184 180    Assessment/Plan: 29 y.o. G2P1011 postpartum day # 1  -Continue routine postpartum care -Lactation consult PRN for breastfeeding   -Acute blood loss anemia - hemodynamically stable and asymptomatic; start PO ferrous sulfate BID with stool softeners  -Immunization status: May needs Tdap prior to discharge  Disposition: Continue inpatient  postpartum care    LOS: 2 days   Sheryle Vice, CNM 05/17/2020, 8:23 AM

## 2020-05-17 NOTE — Lactation Note (Signed)
This note was copied from a baby's chart. Lactation Consultation Note  Patient Name: Boy Jane Birkel HQION'G Date: 05/17/2020 Reason for consult: Follow-up assessment;Mother's request;1st time breastfeeding;Term;Other (Comment) (nipple shield)  LC in to assist with feed post Ped assessment. Baby alert, awake, agitated from assessment but easily calmed. Burp before putting to breast. Attempted feed in football position on left breast with support pillows, educating mom on position and alignment. Attempts made to latch without shield unsuccessful. Baby did grasp the nipple shield and intermittently suck, but not consistently or strong and rhythmic- pushing shield out, taking back in, pushing shield out. Baby settled at breast and appeared to fall asleep.  LC set-up DEBP in room for additional stimulation, milk removal if possible to aid in onset of adequate milk production for baby. Discussed impact that high palate may have on feeding, continuing to work on finding optimal position, use of nipple shield, and continuation of pumping routine over the days to come. Mom directed to pump post all feeds and feeding attempts.  Mom educated on pump set-up, use, cleaning, milk storage and thawing. LC present for first 5 minutes of pumping, assisted in finding comfortable pumping suction level, and reviewed cycles of pump before cut-off at 15 minutes. LC to follow-up.  Maternal Data Formula Feeding for Exclusion: No Has patient been taught Hand Expression?: Yes Does the patient have breastfeeding experience prior to this delivery?: No  Feeding Feeding Type: Breast Fed  LATCH Score Latch: Repeated attempts needed to sustain latch, nipple held in mouth throughout feeding, stimulation needed to elicit sucking reflex.  Audible Swallowing: None  Type of Nipple: Everted at rest and after stimulation  Comfort (Breast/Nipple): Soft / non-tender  Hold (Positioning): Assistance needed to correctly position  infant at breast and maintain latch.  LATCH Score: 6  Interventions Interventions: Breast feeding basics reviewed;Assisted with latch;Hand express;Breast massage;Adjust position;Breast compression;Support pillows;Position options  Lactation Tools Discussed/Used Tools: Nipple Shields Nipple shield size: 20 Pump Review: Setup, frequency, and cleaning;Milk Storage Initiated by:: Magnus Ivan, MPH, IBCLC (due to nipple shield use) Date initiated:: 05/17/20   Consult Status Consult Status: Follow-up Date: 05/17/20 Follow-up type: In-patient    [name] 05/17/2020, 1:58 PM

## 2020-05-17 NOTE — Anesthesia Postprocedure Evaluation (Signed)
Anesthesia Post Note  Patient: Brandy Coleman  Procedure(s) Performed: AN AD HOC LABOR EPIDURAL  Patient location during evaluation: Mother Baby Anesthesia Type: Epidural Level of consciousness: awake and alert Pain management: pain level controlled Vital Signs Assessment: post-procedure vital signs reviewed and stable Respiratory status: spontaneous breathing, nonlabored ventilation and respiratory function stable Cardiovascular status: stable Postop Assessment: no headache, no backache and epidural receding Anesthetic complications: no   No complications documented.   Last Vitals:  Vitals:   05/17/20 0549 05/17/20 0729  BP: 112/69 99/69  Pulse: 80 (!) 104  Resp: 20 17  Temp: 36.6 C 36.7 C  SpO2: 100% 100%    Last Pain:  Vitals:   05/17/20 0642  TempSrc:   PainSc: 3                  Gaberiel Youngblood Lawerance Cruel

## 2020-05-17 NOTE — Lactation Note (Signed)
This note was copied from a baby's chart. Lactation Consultation Note  Patient Name: Brandy Coleman [id number] Date: 05/17/2020 Reason for consult: Initial assessment;Follow-up assessment;1st time breastfeeding;Term;Other (Comment) (spoke with mom before delivery)  Lactation assessment. G2P1 SVD 13 hours ago, LGA with stable BS levels. Nipple shield given by Transition RN who felt baby had difficult latch post delivery and high arch. Six feedings completed overnight/early this AM with shield.  LC encouraged and assisted with attempted latch without nipple shield at 10:30. Baby was able to grasp the breast and stay for 2-3 strong sucks, before becoming frustrated and bobbing of the head. After a few more attempts, nipple shield was placed and baby fought at the breast- bobbing of head, chomping on shield, pushing shield out, passing gas/stooling, before settling with strong rhythmic sucking pattern. LC spoke with parents about possibility of oral restriction due to high arch, and not able to see tongue extend beyond jaw line- reviewed options for assessment by pediatrician and pediatric dentist, but also encouraged parents to see how baby continued to feed over the next 12 hours. Current feeding plan will consist of continuing to feed on cue, offering breast without shield first, applying shield as needed, and begin pumping routine of every 3 hours for additional stimulation/milk removal. Parents in agreeable.  Maternal Data Formula Feeding for Exclusion: No Has patient been taught Hand Expression?: Yes Does the patient have breastfeeding experience prior to this delivery?: No  Feeding Feeding Type: Breast Fed  LATCH Score Latch: Repeated attempts needed to sustain latch, nipple held in mouth throughout feeding, stimulation needed to elicit sucking reflex.  Audible Swallowing: A few with stimulation  Type of Nipple: Everted at rest and after stimulation  Comfort (Breast/Nipple): Soft /  non-tender  Hold (Positioning): Assistance needed to correctly position infant at breast and maintain latch.  LATCH Score: 7  Interventions Interventions: Breast feeding basics reviewed;Assisted with latch;Hand express;Adjust position;Position options (nipple shield)  Lactation Tools Discussed/Used Tools: Nipple Shields Nipple shield size: 20   Consult Status Consult Status: Follow-up Date: 05/17/20 Follow-up type: In-patient    [name] 05/17/2020, 11:11 AM

## 2020-05-17 NOTE — Progress Notes (Signed)
Assisted Evalyne to the bathroom and initially she tolerated well. After sitting on the toilet and then getting back up she stated she felt like she was going to pass out. She was then assisted back onto the toilet, she passed out, she was pale and diaphoretic, help arrived, ammonia was used and at this time she was able to say her name and her location. She was then assisted to the wheelchair and again passed out, turned pale, and diaphoretic. Ammonia was used again and After waking up she was able to state her name and location and her color returned to pink. Cold cloths placed on her, vitals obtained, apple juice given, and a 500 LR bolus started. At 0100 Jovee was transferred to the [facility] floor in stable condition with her newborn. Family at her bedside.

## 2020-05-17 NOTE — Lactation Note (Signed)
This note was copied from a baby's chart. Lactation Consultation Note  Patient Name: Boy Betty Daidone [id number]'U Date: 05/17/2020 Reason for consult: Follow-up assessment  Lactation called to the bedside to assess baby's oral anatomy. LC had baby suck on finger and he is quivering with tongue extension to the gumline, noted a high palate and prominent underbite. Also noted possibly restrictive frenulum with finger sweep under his tongue. LC described nutritive feeding pattern with swallows should be noted. If he is not feeding well by Midnight LC encouraged supplement, pumping and hand expression. Mother was again taught hand expression and was able to express at least 63ml. Discussed feeding colostrum with a spoon. Discussed evaluation of a tongue tie by Pediatric Dentist or ENT that specializes in laser release.  Updated Care RN, encouraged to call for any feeding assistance.   Maternal Data Formula Feeding for Exclusion: No Has patient been taught Hand Expression?: Yes Does the patient have breastfeeding experience prior to this delivery?: No  Feeding Feeding Type: Breast Fed  LATCH Score Latch: Repeated attempts needed to sustain latch, nipple held in mouth throughout feeding, stimulation needed to elicit sucking reflex.  Audible Swallowing: None  Type of Nipple: Everted at rest and after stimulation  Comfort (Breast/Nipple): Soft / non-tender  Hold (Positioning): Assistance needed to correctly position infant at breast and maintain latch.  LATCH Score: 6  Interventions Interventions: Hand express  Lactation Tools Discussed/Used Tools: Nipple Shields Nipple shield size: 20 Pump Review: Setup, frequency, and cleaning;Milk Storage Initiated by:: Magnus Ivan, MPH, IBCLC (due to nipple shield use) Date initiated:: 05/17/20   Consult Status Consult Status: Follow-up Date: 05/18/20 Follow-up type: Call as needed    Lynea Rollison D Bennie Scaff 05/17/2020, 4:09 PM

## 2020-05-18 LAB — COMPREHENSIVE METABOLIC PANEL
ALT: 12 U/L (ref 0–44)
AST: 21 U/L (ref 15–41)
Albumin: 2.6 g/dL — ABNORMAL LOW (ref 3.5–5.0)
Alkaline Phosphatase: 111 U/L (ref 38–126)
Anion gap: 10 (ref 5–15)
BUN: 6 mg/dL (ref 6–20)
CO2: 21 mmol/L — ABNORMAL LOW (ref 22–32)
Calcium: 8.3 mg/dL — ABNORMAL LOW (ref 8.9–10.3)
Chloride: 105 mmol/L (ref 98–111)
Creatinine, Ser: 0.89 mg/dL (ref 0.44–1.00)
GFR, Estimated: >60 mL/min (ref >60)
Glucose, Bld: 152 mg/dL — ABNORMAL HIGH (ref 70–99)
Potassium: 3.2 mmol/L — ABNORMAL LOW (ref 3.5–5.1)
Sodium: 136 mmol/L (ref 135–145)
Total Bilirubin: 0.4 mg/dL (ref 0.3–1.2)
Total Protein: 5.4 g/dL — ABNORMAL LOW (ref 6.5–8.1)

## 2020-05-18 LAB — CBC
HCT: 27.8 % — ABNORMAL LOW (ref 36.0–46.0)
Hemoglobin: 8.9 g/dL — ABNORMAL LOW (ref 12.0–15.0)
MCH: 28.9 pg (ref 26.0–34.0)
MCHC: 32 g/dL (ref 30.0–36.0)
MCV: 90.3 fL (ref 80.0–100.0)
Platelets: 142 10*3/uL — ABNORMAL LOW (ref 150–400)
RBC: 3.08 MIL/uL — ABNORMAL LOW (ref 3.87–5.11)
RDW: 15.3 % (ref 11.5–15.5)
WBC: 14.7 10*3/uL — ABNORMAL HIGH (ref 4.0–10.5)
nRBC: 0 % (ref 0.0–0.2)

## 2020-05-18 LAB — CBC WITH DIFFERENTIAL/PLATELET
Abs Immature Granulocytes: 0.09 10*3/uL — ABNORMAL HIGH (ref 0.00–0.07)
Basophils Absolute: 0.1 10*3/uL (ref 0.0–0.1)
Basophils Relative: 0 %
Eosinophils Absolute: 0.4 10*3/uL (ref 0.0–0.5)
Eosinophils Relative: 3 %
HCT: 26.3 % — ABNORMAL LOW (ref 36.0–46.0)
Hemoglobin: 8.6 g/dL — ABNORMAL LOW (ref 12.0–15.0)
Immature Granulocytes: 1 %
Lymphocytes Relative: 17 %
Lymphs Abs: 2.7 10*3/uL (ref 0.7–4.0)
MCH: 28.2 pg (ref 26.0–34.0)
MCHC: 32.7 g/dL (ref 30.0–36.0)
MCV: 86.2 fL (ref 80.0–100.0)
Monocytes Absolute: 1.5 10*3/uL — ABNORMAL HIGH (ref 0.1–1.0)
Monocytes Relative: 9 %
Neutro Abs: 10.8 10*3/uL — ABNORMAL HIGH (ref 1.7–7.7)
Neutrophils Relative %: 70 %
Platelets: 154 10*3/uL (ref 150–400)
RBC: 3.05 MIL/uL — ABNORMAL LOW (ref 3.87–5.11)
RDW: 15.5 % (ref 11.5–15.5)
WBC: 15.5 10*3/uL — ABNORMAL HIGH (ref 4.0–10.5)
nRBC: 0 % (ref 0.0–0.2)

## 2020-05-18 MED ORDER — FAMOTIDINE 20 MG PO TABS
20.0000 mg | ORAL_TABLET | Freq: Once | ORAL | Status: DC
  Filled 2020-05-18: qty 1

## 2020-05-18 MED ORDER — METHYLPREDNISOLONE SODIUM SUCC 40 MG IJ SOLR
40.0000 mg | Freq: Once | INTRAMUSCULAR | Status: AC
  Administered 2020-05-18: 40 mg via INTRAVENOUS
  Filled 2020-05-18: qty 1

## 2020-05-18 MED ORDER — FERROUS SULFATE 325 (65 FE) MG PO TABS: 325.0000 mg | ORAL_TABLET | Freq: Two times a day (BID) | ORAL | 0 refills | Status: AC

## 2020-05-18 MED ORDER — IBUPROFEN 600 MG PO TABS: 600.0000 mg | ORAL_TABLET | Freq: Four times a day (QID) | ORAL | 0 refills | Status: AC

## 2020-05-18 MED ORDER — ACETAMINOPHEN 500 MG PO TABS: 1000.0000 mg | ORAL_TABLET | Freq: Four times a day (QID) | ORAL | 0 refills | Status: AC | PRN

## 2020-05-18 MED ORDER — POTASSIUM CHLORIDE CRYS ER 20 MEQ PO TBCR
40.0000 meq | EXTENDED_RELEASE_TABLET | Freq: Once | ORAL | Status: AC
  Administered 2020-05-18: 40 meq via ORAL
  Filled 2020-05-18: qty 2

## 2020-05-18 MED ORDER — SODIUM CHLORIDE 0.9 % IV SOLN
300.0000 mg | Freq: Once | INTRAVENOUS | Status: AC
  Administered 2020-05-18: 300 mg via INTRAVENOUS
  Filled 2020-05-18: qty 15

## 2020-05-18 MED ORDER — LORAZEPAM 2 MG/ML IJ SOLN
0.5000 mg | Freq: Once | INTRAMUSCULAR | Status: DC
  Filled 2020-05-18: qty 1

## 2020-05-18 MED ORDER — DOCUSATE SODIUM 100 MG PO CAPS: 100.0000 mg | ORAL_CAPSULE | Freq: Two times a day (BID) | ORAL | 0 refills | Status: AC

## 2020-05-18 NOTE — Lactation Note (Signed)
This note was copied from a baby's chart. Lactation Consultation Note  Patient Name: Brandy Coleman [id number] Date: 05/18/2020  Mom and baby have not been discharged because mom had an allergic reaction to iron infusion, mom has not felt like attempting breastfeeding this pm, so has had family bottlefeed baby donor breast milk and she has pumped her breasts, she states she had some colostrum leak from her breast this pm, encouraged to pump breasts every 3 hrs until she is able to get baby back to the breast.      Maternal Data    Feeding    LATCH Score                   Interventions    Lactation Tools Discussed/Used     Consult Status      Dyann Kief 05/18/2020, 7:02 PM

## 2020-05-18 NOTE — Lactation Note (Addendum)
This note was copied from a baby's chart. Lactation Consultation Note  Patient Name: Brandy Coleman [id number]'J Date: 05/18/2020 Reason for consult: Follow-up assessment Attempt at 1040-1115 to latch baby to breast with nipple shield with mom in side lying position, baby difficult to open mouth wide, needs chin pressure to open, once on breast with shield, will suck lightly a few times then  Just hold nipple in mouth, needs lots of stimulation to suck, had circ this am so sleepy after circ also,  Attempted on both breasts in side lying position, attempted having baby suck on my gloved finger and baby did the same as breast, suck a few times then hold nipple in mouth, baby pulls in chin at rest, pursed lips, high palate, decision made to supplement with donor milk, I called Dr. Earnest Conroy to request a OT consult before d/c, this request was made in computer and I spoke by phone with Rockney Ghee about a consult with this mom and dad before d/c.  Before feeding donor milk by bottle, mom attempted breast again as baby was crying, baby still sucked 2-3 times and held nipple in mouth, proceeded with donor milk supp by mom's mom, instructed mom and grandmother in paced bottlefeeding, baby needed chin pressure to open mouth and struggled at first with coordinating suck, but then took 20 cc donor milk with ease.  Also note that baby's face is yellow, but bili is in normal range.  BAby has brusing on back of head from caput.         Maternal Data Formula Feeding for Exclusion: No MOM able to express colostrum from both breasts before attempting breastfeeding with nipple shield, shield had colostrum in shaft when baby came off breast at first attempt  Feeding Feeding Type: Breast Milk with Donor Milk BAby curls tongue up to roof of mouth and humps back of tongue, higher palate short frenulum noted, assessed borderline tongue tie per [facility] tongue tie assessment tool, possible tight frenulum on upper lip, pulls in  lower lip and chin at rest. LATCH Score Latch: Repeated attempts needed to sustain latch, nipple held in mouth throughout feeding, stimulation needed to elicit sucking reflex.  Audible Swallowing: None  Type of Nipple: Everted at rest and after stimulation  Comfort (Breast/Nipple): Soft / non-tender  Hold (Positioning): Assistance needed to correctly position infant at breast and maintain latch.  LATCH Score: 6  Interventions Interventions: Assisted with latch;Hand express;DEBP  Lactation Tools Discussed/Used Tools: Nipple Shields Nipple shield size: 20 WIC Program: No Mom given Medela nipple shield instruction sheet, handouts regarding dentists who are able to assess lip and tongue tie if needed.  Consult Status Consult Status: PRN Date: 05/18/20 Follow-up type: In-patient    Dyann Kief 05/18/2020, 12:47 PM

## 2020-05-18 NOTE — Progress Notes (Signed)
Post Partum Day 2 Subjective: Doing well, no complaints.  Tolerating regular diet, pain with PO meds, voiding and ambulating without difficulty.  No CP SOB Fever,Chills, N/V or leg pain; denies nipple or breast pain, no HA change of vision, RUQ/epigastric pain  Objective: BP 108/72 (BP Location: Right Arm)   Pulse (!) 103   Temp 97.6 F (36.4 C) (Oral)   Resp 18   Ht 5\' 6"  (1.676 m)   Wt 105.2 kg   LMP 08/08/2019   SpO2 100%   Breastfeeding Unknown   BMI 37.45 kg/m    Physical Exam:  General: NAD Breasts: soft/nontender CV: RRR Pulm: nl effort, CTABL Abdomen: soft, NT, BS x 4 Perineum: minimal edema, repair well approximated, 2 small hemorrhoids, non-thrombosed.  Lochia: scant Uterine Fundus: fundus firm and 2 fb below umbilicus DVT Evaluation: no cords, ttp LEs   Recent Labs    05/17/20 0620 05/18/20 0503  HGB 10.3* 8.6*  HCT 30.1* 26.3*  WBC 24.9* 15.5*  PLT 180 154    Assessment/Plan: 29 y.o. G2P1011 postpartum day # 2  - Continue routine PP care - Lactation consult prn, encouraged to make outpatient appt for follow-up.   - Discussed contraceptive options including implant, IUDs hormonal and non-hormonal, injection, pills/ring/patch, condoms, and NFP. PLans POPs - Acute blood loss anemia - hemodynamically stable and asymptomatic; start po ferrous sulfate BID with stool softeners at discharge, will give IV iron x 1 prior to DC today.   - Immunization status: all Imms up to date    Disposition: Does  desire Dc home today.     13/05/21, CNM 05/18/2020  9:20 AM

## 2020-05-18 NOTE — Discharge Instructions (Signed)
Discharge Instructions:    Follow-up Appointment: Schedule your postpartum follow-up appointment ASAP for a visit in 6 weeks!    If there are any new medications, they have been ordered and will be available for pickup at the listed pharmacy on your way home from the hospital.    Call office if you have any of the following: headache, visual changes, fever >101.0 F, chills, shortness of breath, breast concerns, excessive vaginal bleeding, incision drainage or problems, leg pain or redness, depression or any other concerns. If you have vaginal discharge with an odor, let your doctor know.    It is normal to bleed for up to 6 weeks. You should not soak through more than 1 pad in 1 hour. If you have a blood clot larger than your fist with continued bleeding, call your doctor.    Activity: Do not lift > 10 lbs for 6 weeks (do not lift anything heavier than your baby). No intercourse, tampons, swimming pools, hot tubs, baths (only showers) for 6 weeks.  No driving for 1-2 weeks. Continue prenatal vitamin, especially if breastfeeding. Increase calories and fluids (water) while breastfeeding.    Your milk will come in, in the next couple of days (right now it is colostrum). You may have a slight fever when your milk comes in, but it should go away on its own.  If it does not, and rises above 101 F please call the doctor. You will also feel achy and your breasts will be firm. They will also start to leak. If you are breastfeeding, continue as you have been and you can pump/express milk for comfort.    If you have too much milk, your breasts can become engorged, which could lead to mastitis. This is an infection of the milk ducts. It can be very painful and you will need to notify your doctor to obtain a prescription for antibiotics. You can also treat it with a shower or hot/cold compress.    For concerns about your baby, please call your pediatrician.  For breastfeeding concerns, the lactation  consultant can be reached at [phone].    Postpartum blues (feelings of happy one minute and sad another minute) are normal for the first few weeks but if it gets worse let your doctor know.    Congratulations! We enjoyed caring for you and your new bundle of joy!    Postpartum Care After Vaginal Delivery This sheet gives you information about how to care for yourself from the time you deliver your baby to up to 6-12 weeks after delivery (postpartum period). Your health care provider may also give you more specific instructions. If you have problems or questions, contact your health care provider. Follow these instructions at home: Vaginal bleeding  It is normal to have vaginal bleeding (lochia) after delivery. Wear a sanitary pad for vaginal bleeding and discharge. ? During the first week after delivery, the amount and appearance of lochia is often similar to a menstrual period. ? Over the next few weeks, it will gradually decrease to a dry, yellow-brown discharge. ? For most women, lochia stops completely by 4-6 weeks after delivery. Vaginal bleeding can vary from woman to woman.  Change your sanitary pads frequently. Watch for any changes in your flow, such as: ? A sudden increase in volume. ? A change in color. ? Large blood clots.  If you pass a blood clot from your vagina, save it and call your health care provider to discuss. Do not flush blood  clots down the toilet before talking with your health care provider.  Do not use tampons or douches until your health care provider says this is safe.  If you are not breastfeeding, your period should return 6-8 weeks after delivery. If you are feeding your child breast milk only (exclusive breastfeeding), your period may not return until you stop breastfeeding. Perineal care  Keep the area between the vagina and the anus (perineum) clean and dry as told by your health care provider. Use medicated pads and pain-relieving sprays and  creams as directed.  If you had a cut in the perineum (episiotomy) or a tear in the vagina, check the area for signs of infection until you are healed. Check for: ? More redness, swelling, or pain. ? Fluid or blood coming from the cut or tear. ? Warmth. ? Pus or a bad smell.  You may be given a squirt bottle to use instead of wiping to clean the perineum area after you go to the bathroom. As you start healing, you may use the squirt bottle before wiping yourself. Make sure to wipe gently.  To relieve pain caused by an episiotomy, a tear in the vagina, or swollen veins in the anus (hemorrhoids), try taking a warm sitz bath 2-3 times a day. A sitz bath is a warm water bath that is taken while you are sitting down. The water should only come up to your hips and should cover your buttocks. Breast care  Within the first few days after delivery, your breasts may feel heavy, full, and uncomfortable (breast engorgement). Milk may also leak from your breasts. Your health care provider can suggest ways to help relieve the discomfort. Breast engorgement should go away within a few days.  If you are breastfeeding: ? Wear a bra that supports your breasts and fits you well. ? Keep your nipples clean and dry. Apply creams and ointments as told by your health care provider. ? You may need to use breast pads to absorb milk that leaks from your breasts. ? You may have uterine contractions every time you breastfeed for up to several weeks after delivery. Uterine contractions help your uterus return to its normal size. ? If you have any problems with breastfeeding, work with your health care provider or Advertising copywriter.  If you are not breastfeeding: ? Avoid touching your breasts a lot. Doing this can make your breasts produce more milk. ? Wear a good-fitting bra and use cold packs to help with swelling. ? Do not squeeze out (express) milk. This causes you to make more milk. Intimacy and sexuality  Ask  your health care provider when you can engage in sexual activity. This may depend on: ? Your risk of infection. ? How fast you are healing. ? Your comfort and desire to engage in sexual activity.  You are able to get pregnant after delivery, even if you have not had your period. If desired, talk with your health care provider about methods of birth control (contraception). Medicines  Take over-the-counter and prescription medicines only as told by your health care provider.  If you were prescribed an antibiotic medicine, take it as told by your health care provider. Do not stop taking the antibiotic even if you start to feel better. Activity  Gradually return to your normal activities as told by your health care provider. Ask your health care provider what activities are safe for you.  Rest as much as possible. Try to rest or take a nap while  your baby is sleeping. Eating and drinking   Drink enough fluid to keep your urine pale yellow.  Eat high-fiber foods every day. These may help prevent or relieve constipation. High-fiber foods include: ? Whole grain cereals and breads. ? Brown rice. ? Beans. ? Fresh fruits and vegetables.  Do not try to lose weight quickly by cutting back on calories.  Take your prenatal vitamins until your postpartum checkup or until your health care provider tells you it is okay to stop. Lifestyle  Do not use any products that contain nicotine or tobacco, such as cigarettes and e-cigarettes. If you need help quitting, ask your health care provider.  Do not drink alcohol, especially if you are breastfeeding. General instructions  Keep all follow-up visits for you and your baby as told by your health care provider. Most women visit their health care provider for a postpartum checkup within the first 3-6 weeks after delivery. Contact a health care provider if:  You feel unable to cope with the changes that your child brings to your life, and these  feelings do not go away.  You feel unusually sad or worried.  Your breasts become red, painful, or hard.  You have a fever.  You have trouble holding urine or keeping urine from leaking.  You have little or no interest in activities you used to enjoy.  You have not breastfed at all and you have not had a menstrual period for 12 weeks after delivery.  You have stopped breastfeeding and you have not had a menstrual period for 12 weeks after you stopped breastfeeding.  You have questions about caring for yourself or your baby.  You pass a blood clot from your vagina. Get help right away if:  You have chest pain.  You have difficulty breathing.  You have sudden, severe leg pain.  You have severe pain or cramping in your lower abdomen.  You bleed from your vagina so much that you fill more than one sanitary pad in one hour. Bleeding should not be heavier than your heaviest period.  You develop a severe headache.  You faint.  You have blurred vision or spots in your vision.  You have bad-smelling vaginal discharge.  You have thoughts about hurting yourself or your baby. If you ever feel like you may hurt yourself or others, or have thoughts about taking your own life, get help right away. You can go to the nearest emergency department or call:  Your local emergency services (911 in the [place]  A suicide crisis helpline, such as the National Suicide Prevention Lifeline at 564-638-9735. This is open 24 hours a day. Summary  The period of time right after you deliver your newborn up to 6-12 weeks after delivery is called the postpartum period.  Gradually return to your normal activities as told by your health care provider.  Keep all follow-up visits for you and your baby as told by your health care provider. This information is not intended to replace advice given to you by your health care provider. Make sure you discuss any questions you have with your health care  provider. Document Revised: 07/03/2017 Document Reviewed: 04/13/2017 Elsevier Patient Education  2020 [employer].  Postpartum Baby Blues The postpartum period begins right after the birth of a baby. During this time, there is often a lot of joy and excitement. It is also a time of many changes in the life of the parents. No matter how many times a mother gives birth, each  child brings new challenges to the family, including different ways of relating to one another. It is common to have feelings of excitement along with confusing changes in moods, emotions, and thoughts. You may feel happy one minute and sad or stressed the next. These feelings of sadness usually happen in the period right after you have your baby, and they go away within a week or two. This is called the "baby blues." What are the causes? There is no known cause of baby blues. It is likely caused by a combination of factors. However, changes in hormone levels after childbirth are believed to trigger some of the symptoms. Other factors that can play a role in these mood changes include:  Lack of sleep.  Stressful life events, such as poverty, caring for a loved one, or death of a loved one.  Genetics. What are the signs or symptoms? Symptoms of this condition include:  Brief changes in mood, such as going from extreme happiness to sadness.  Decreased concentration.  Difficulty sleeping.  Crying spells and tearfulness.  Loss of appetite.  Irritability.  Anxiety. If the symptoms of baby blues last for more than 2 weeks or become more severe, you may have postpartum depression. How is this diagnosed? This condition is diagnosed based on an evaluation of your symptoms. There are no medical or lab tests that lead to a diagnosis, but there are various questionnaires that a health care provider may use to identify women with the baby blues or postpartum depression. How is this treated? Treatment is not needed for this  condition. The baby blues usually go away on their own in 1-2 weeks. Social support is often all that is needed. You will be encouraged to get adequate sleep and rest. Follow these instructions at home: Lifestyle      Get as much rest as you can. Take a nap when the baby sleeps.  Exercise regularly as told by your health care provider. Some women find yoga and walking to be helpful.  Eat a balanced and nourishing diet. This includes plenty of fruits and vegetables, whole grains, and lean proteins.  Do little things that you enjoy. Have a cup of tea, take a bubble bath, read your favorite magazine, or listen to your favorite music.  Avoid alcohol.  Ask for help with household chores, cooking, grocery shopping, or running errands. Do not try to do everything yourself. Consider hiring a postpartum doula to help. This is a professional who specializes in providing support to new mothers.  Try not to make any major life changes during pregnancy or right after giving birth. This can add stress. General instructions  Talk to people close to you about how you are feeling. Get support from your partner, family members, friends, or other new moms. You may want to join a support group.  Find ways to cope with stress. This may include: ? Writing your thoughts and feelings in a journal. ? Spending time outside. ? Spending time with people who make you laugh.  Try to stay positive in how you think. Think about the things you are grateful for.  Take over-the-counter and prescription medicines only as told by your health care provider.  Let your health care provider know if you have any concerns.  Keep all postpartum visits as told by your health care provider. This is important. Contact a health care provider if:  Your baby blues do not go away after 2 weeks. Get help right away if:  You  have thoughts of taking your own life (suicidal thoughts).  You think you may harm the baby or other  people.  You see or hear things that are not there (hallucinations). Summary  After giving birth, you may feel happy one minute and sad or stressed the next. Feelings of sadness that happen right after the baby is born and go away after a week or two are called the "baby blues."  You can manage the baby blues by getting enough rest, eating a healthy diet, exercising, spending time with supportive people, and finding ways to cope with stress.  If feelings of sadness and stress last longer than 2 weeks or get in the way of caring for your baby, talk to your health care provider. This may mean you have postpartum depression. This information is not intended to replace advice given to you by your health care provider. Make sure you discuss any questions you have with your health care provider. Document Revised: 10/22/2018 Document Reviewed: 08/26/2016 Elsevier Patient Education  2020 [employer].  Paced Infant Bottle Feeding Paced bottle feeding is a way to bottle feed your baby that is more like breastfeeding. This type of feeding helps your baby learn to eat more slowly and stop when full. This can help prevent overfeeding and discomfort. Paced bottle feeding may also help your baby feel comfortable with both bottle feeding and breastfeeding. The goal is to provide a bottle feeding that is similar to the pace and flow of breast milk from the breast. This is done by holding the bottle in a way that controls the flow of milk, and by taking periodic breaks during feedings. Paced bottle feeding works well if you want to continue breastfeeding but will sometimes need to bottle feed your baby with pumped breast milk or formula. You may also want to consider this method if:  You are unable to breastfeed.  You want others to feed your baby, such as if you are returning to work. How to plan for paced bottle feeding Before you start bottle feeding, talk to your child's health care provider or a  Advertising copywriter. Ask what type of formula and bottle would work best for your baby. In general, a 7-ounce bottle with a slow flow nipple works well. If you are going to pump breast milk, ask how often you should pump, and learn how to pump and store your milk safely. Plan to bottle feed on demand, which means feeding whenever your baby shows signs of being hungry. Your baby may:  Put fingers or a hand into his or her mouth.  Clench fists over the tummy or flex arms and legs.  Turn the head and open the mouth as if looking for a nipple (rooting).  Make sucking noises.  Cry. This is usually a late sign of hunger.  Act fussy or restless. How to prepare the bottle To get the bottle ready for bottle feeding:  Wash your hands.  Make sure the bottle and nipple are clean. ? If you are using the bottle for the first time, sterilize all parts in boiling water for 10 minutes; cool and air-dry. ? After the first use, you can clean all the bottle parts in hot, soapy water and rinse thoroughly once a day.  If you are using formula, follow the directions for mixing the formula or the instructions from your child's health care provider.  If you are using breast milk, thaw the milk in the refrigerator. Do not use breast milk  after it has been thawed or stored in the refrigerator for longer than 24 hours.  You may heat the bottle in warm water. Make sure it is not too hot or too cold. Never use a microwave to warm up a bottle. How to perform paced bottle feeding Follow these steps for paced bottle feeding: 1. Hold your baby close to your body at a slight angle, or semi-upright, as you would for breastfeeding. Your baby's head should be higher than his or her stomach. 2. Support your baby's head in the crook of your arm. 3. Place the nipple on your baby's cheek. Let your baby root around to find the nipple. You may tickle or stroke your baby's lips with the nipple to stimulate rooting. Let your  baby draw the bottle nipple into the mouth on his or her own, just like the baby does with breastfeeding. 4. Once your baby latches on to the nipple, hold the bottle flat, parallel to the floor. This will help your baby control the flow of milk so that it does not come out too fast. 5. Tip the bottle slightly to let about half the nipple fill. Do not tilt the bottle straight up into the air. This will force too much milk or formula into your baby's mouth. 6. After about three to five sucks, tilt the bottle back to flat, wait a few seconds, and tilt back up slightly. Continue tilting and pausing. This allows your baby to pace the feeding. 7. About halfway through the feeding, switch arms so you are holding your baby on the other side. This is similar to switching breasts when breastfeeding. 8. Watch for signs that your baby is full. When your baby has had enough to eat, he or she may: ? Eat more slowly or stop. ? Become distracted. ? Turn away and stop sucking. ? Become very relaxed or fall asleep. ? Have his or her hands open and relaxed. 9. When it is time to stop, gently remove the nipple from your baby's mouth. Offer the nipple again and let your baby feed for about three to five sucks. Remove the nipple again. Keep offering and removing until your baby refuses the nipple or no longer sucks. 10. Try to have a bottle feeding last about the same amount of time as a breastfeeding session. This is usually around 15-20 minutes. General tips  Watch for the following signs that your baby may be overfeeding or eating too quickly: ? Gulping. ? Drooling. ? Noisy feeding. ? Coughing or choking.  Start paced bottle feeding on demand. Over time, your baby will become hungry at predictable times.  Feed your baby in a quiet and comfortable place. Avoid distractions. Pay attention to pacing and signs of fullness.  Do not bottle feed your baby anything other than breast milk or formula until your baby's  health care provider says that you can start other feedings.  Let your baby's health care provider know if your baby: ? Is fussy or seems uncomfortable after feeding. ? Vomits after feedings. ? Refuses to take the bottle or your breast. Where to find more information [place] Department of Health and Human Services: https://torres-moran.org/ Summary  Paced bottle feeding is a way to bottle feed your baby that is more like breastfeeding.  Paced bottle feeding helps your baby learn to eat only when hungry and to avoid overeating.  Ask your baby's health care provider to recommend types of bottles and formula. If you plan to use pumped breast  milk, learn how to pump and store your milk.  Follow the steps for performing paced feeding and stop when your baby shows signs of being full. This information is not intended to replace advice given to you by your health care provider. Make sure you discuss any questions you have with your health care provider. Document Revised: 04/16/2017 Document Reviewed: 04/16/2017 Elsevier Patient Education  2020 [employer].  Breastfeeding Tips for a Good Latch Latching is how your baby's mouth attaches to your nipple to breastfeed. It is an important part of breastfeeding. Your baby may have trouble latching for a number of reasons. A poor latch may cause you to have cracked or sore nipples or other problems. Follow these instructions at home: How to position your baby  Find a comfortable place to sit or lie down. Your neck and back should be well supported.  If you are seated, place a pillow or rolled-up blanket under your baby. This will bring him or her to the level of your breast.  Make sure that your baby's belly (abdomen) is facing your belly.  Try different positions to find one that works best for you and your baby. How to help your baby latch   To start, gently rub your breast. Move your fingertips in a circle as you massage from your  chest wall toward your nipple. This helps milk flow. Keep doing this during feeding if needed.  Position your breast. Hold your breast with four fingers underneath and your thumb above your nipple. Keep your fingers away from your nipple and your baby's mouth. Follow these steps to help your baby latch: 1. Rub your baby's lips gently with your finger or nipple. 2. When your baby's mouth is open wide enough, quickly bring your baby to your breast and place your whole nipple into your baby's mouth. Place as much of the colored area around your nipple (areola)as possible into your baby's mouth. 3. Your baby's tongue should be between his or her lower gum and your breast. 4. You should be able to see more areola above your baby's upper lip than below the lower lip. 5. When your baby starts sucking, you will feel a gentle pull on your nipple. You should not feel any pain. Be patient. It is common for a baby to suck for about 2-3 minutes to start the flow of breast milk. 6. Make sure that your baby's mouth is in the right position around your nipple. Your baby's lips should make a seal on your breast and be turned outward.  General instructions  Look for these signs that your baby has latched on to your nipple: ? The baby is quietly tugging or sucking without causing you pain. ? You hear the baby swallow after every 3 or 4 sucks. ? You see movement above and in front of the baby's ears while he or she is sucking.  Be aware of these signs that your baby has not latched on to your nipple: ? The baby makes sucking sounds or smacking sounds while feeding. ? You have nipple pain.  If your baby is not latched well, put your little finger between your baby's gums and your nipple. This will break the seal. Then try to help your baby latch again.  If you keep having problems, get help from a breastfeeding specialist (Advertising copywriter). Contact a doctor if:  You have cracking or soreness in your  nipples that lasts longer than 1 week.  You have nipple pain.  Your breasts are filled with too much milk (engorgement), and this does not improve after 48-72 hours.  You have a plugged milk duct and a fever.  You follow the tips for a good latch but you keep having problems or concerns.  You have a pus-like fluid coming from your breast.  Your baby is not gaining weight.  Your baby loses weight. Summary  Latching is how your baby's mouth attaches to your nipple to breastfeed.  Try different positions for breastfeeding to find one that works best for you and your baby.  A poor latch may cause you to have cracked or sore nipples or other problems. This information is not intended to replace advice given to you by your health care provider. Make sure you discuss any questions you have with your health care provider. Document Revised: 10/20/2018 Document Reviewed: 02/04/2017 Elsevier Patient Education  2020 [employer].  Breastfeeding  Choosing to breastfeed is one of the best decisions you can make for yourself and your baby. A change in hormones during pregnancy causes your breasts to make breast milk in your milk-producing glands. Hormones prevent breast milk from being released before your baby is born. They also prompt milk flow after birth. Once breastfeeding has begun, thoughts of your baby, as well as his or her sucking or crying, can stimulate the release of milk from your milk-producing glands. Benefits of breastfeeding Research shows that breastfeeding offers many health benefits for infants and mothers. It also offers a cost-free and convenient way to feed your baby. For your baby  Your first milk (colostrum) helps your baby's digestive system to function better.  Special cells in your milk (antibodies) help your baby to fight off infections.  Breastfed babies are less likely to develop asthma, allergies, obesity, or type 2 diabetes. They are also at lower risk for  sudden infant death syndrome (SIDS).  Nutrients in breast milk are better able to meet your baby's needs compared to infant formula.  Breast milk improves your baby's brain development. For you  Breastfeeding helps to create a very special bond between you and your baby.  Breastfeeding is convenient. Breast milk costs nothing and is always available at the correct temperature.  Breastfeeding helps to burn calories. It helps you to lose the weight that you gained during pregnancy.  Breastfeeding makes your uterus return faster to its size before pregnancy. It also slows bleeding (lochia) after you give birth.  Breastfeeding helps to lower your risk of developing type 2 diabetes, osteoporosis, rheumatoid arthritis, cardiovascular disease, and breast, ovarian, uterine, and endometrial cancer later in life. Breastfeeding basics Starting breastfeeding  Find a comfortable place to sit or lie down, with your neck and back well-supported.  Place a pillow or a rolled-up blanket under your baby to bring him or her to the level of your breast (if you are seated). Nursing pillows are specially designed to help support your arms and your baby while you breastfeed.  Make sure that your baby's tummy (abdomen) is facing your abdomen.  Gently massage your breast. With your fingertips, massage from the outer edges of your breast inward toward the nipple. This encourages milk flow. If your milk flows slowly, you may need to continue this action during the feeding.  Support your breast with 4 fingers underneath and your thumb above your nipple (make the letter "C" with your hand). Make sure your fingers are well away from your nipple and your baby's mouth.  Stroke your baby's lips gently with  your finger or nipple.  When your baby's mouth is open wide enough, quickly bring your baby to your breast, placing your entire nipple and as much of the areola as possible into your baby's mouth. The areola is the  colored area around your nipple. ? More areola should be visible above your baby's upper lip than below the lower lip. ? Your baby's lips should be opened and extended outward (flanged) to ensure an adequate, comfortable latch. ? Your baby's tongue should be between his or her lower gum and your breast.  Make sure that your baby's mouth is correctly positioned around your nipple (latched). Your baby's lips should create a seal on your breast and be turned out (everted).  It is common for your baby to suck about 2-3 minutes in order to start the flow of breast milk. Latching Teaching your baby how to latch onto your breast properly is very important. An improper latch can cause nipple pain, decreased milk supply, and poor weight gain in your baby. Also, if your baby is not latched onto your nipple properly, he or she may swallow some air during feeding. This can make your baby fussy. Burping your baby when you switch breasts during the feeding can help to get rid of the air. However, teaching your baby to latch on properly is still the best way to prevent fussiness from swallowing air while breastfeeding. Signs that your baby has successfully latched onto your nipple  Silent tugging or silent sucking, without causing you pain. Infant's lips should be extended outward (flanged).  Swallowing heard between every 3-4 sucks once your milk has started to flow (after your let-down milk reflex occurs).  Muscle movement above and in front of his or her ears while sucking. Signs that your baby has not successfully latched onto your nipple  Sucking sounds or smacking sounds from your baby while breastfeeding.  Nipple pain. If you think your baby has not latched on correctly, slip your finger into the corner of your baby's mouth to break the suction and place it between your baby's gums. Attempt to start breastfeeding again. Signs of successful breastfeeding Signs from your baby  Your baby will  gradually decrease the number of sucks or will completely stop sucking.  Your baby will fall asleep.  Your baby's body will relax.  Your baby will retain a small amount of milk in his or her mouth.  Your baby will let go of your breast by himself or herself. Signs from you  Breasts that have increased in firmness, weight, and size 1-3 hours after feeding.  Breasts that are softer immediately after breastfeeding.  Increased milk volume, as well as a change in milk consistency and color by the fifth day of breastfeeding.  Nipples that are not sore, cracked, or bleeding. Signs that your baby is getting enough milk  Wetting at least 1-2 diapers during the first 24 hours after birth.  Wetting at least 5-6 diapers every 24 hours for the first week after birth. The urine should be clear or pale yellow by the age of 5 days.  Wetting 6-8 diapers every 24 hours as your baby continues to grow and develop.  At least 3 stools in a 24-hour period by the age of 5 days. The stool should be soft and yellow.  At least 3 stools in a 24-hour period by the age of 7 days. The stool should be seedy and yellow.  No loss of weight greater than 10% of birth weight  during the first 3 days of life.  Average weight gain of 4-7 oz (113-198 g) per week after the age of 4 days.  Consistent daily weight gain by the age of 5 days, without weight loss after the age of 2 weeks. After a feeding, your baby may spit up a small amount of milk. This is normal. Breastfeeding frequency and duration Frequent feeding will help you make more milk and can prevent sore nipples and extremely full breasts (breast engorgement). Breastfeed when you feel the need to reduce the fullness of your breasts or when your baby shows signs of hunger. This is called "breastfeeding on demand." Signs that your baby is hungry include:  Increased alertness, activity, or restlessness.  Movement of the head from side to side.  Opening of the  mouth when the corner of the mouth or cheek is stroked (rooting).  Increased sucking sounds, smacking lips, cooing, sighing, or squeaking.  Hand-to-mouth movements and sucking on fingers or hands.  Fussing or crying. Avoid introducing a pacifier to your baby in the first 4-6 weeks after your baby is born. After this time, you may choose to use a pacifier. Research has shown that pacifier use during the first year of a baby's life decreases the risk of sudden infant death syndrome (SIDS). Allow your baby to feed on each breast as long as he or she wants. When your baby unlatches or falls asleep while feeding from the first breast, offer the second breast. Because newborns are often sleepy in the first few weeks of life, you may need to awaken your baby to get him or her to feed. Breastfeeding times will vary from baby to baby. However, the following rules can serve as a guide to help you make sure that your baby is properly fed:  Newborns (babies 36 weeks of age or younger) may breastfeed every 1-3 hours.  Newborns should not go without breastfeeding for longer than 3 hours during the day or 5 hours during the night.  You should breastfeed your baby a minimum of 8 times in a 24-hour period. Breast milk pumping     Pumping and storing breast milk allows you to make sure that your baby is exclusively fed your breast milk, even at times when you are unable to breastfeed. This is especially important if you go back to work while you are still breastfeeding, or if you are not able to be present during feedings. Your lactation consultant can help you find a method of pumping that works best for you and give you guidelines about how long it is safe to store breast milk. Caring for your breasts while you breastfeed Nipples can become dry, cracked, and sore while breastfeeding. The following recommendations can help keep your breasts moisturized and healthy:  Avoid using soap on your nipples.  Wear a  supportive bra designed especially for nursing. Avoid wearing underwire-style bras or extremely tight bras (sports bras).  Air-dry your nipples for 3-4 minutes after each feeding.  Use only cotton bra pads to absorb leaked breast milk. Leaking of breast milk between feedings is normal.  Use lanolin on your nipples after breastfeeding. Lanolin helps to maintain your skin's normal moisture barrier. Pure lanolin is not harmful (not toxic) to your baby. You may also hand express a few drops of breast milk and gently massage that milk into your nipples and allow the milk to air-dry. In the first few weeks after giving birth, some women experience breast engorgement. Engorgement can make  your breasts feel heavy, warm, and tender to the touch. Engorgement peaks within 3-5 days after you give birth. The following recommendations can help to ease engorgement:  Completely empty your breasts while breastfeeding or pumping. You may want to start by applying warm, moist heat (in the shower or with warm, water-soaked hand towels) just before feeding or pumping. This increases circulation and helps the milk flow. If your baby does not completely empty your breasts while breastfeeding, pump any extra milk after he or she is finished.  Apply ice packs to your breasts immediately after breastfeeding or pumping, unless this is too uncomfortable for you. To do this: ? Put ice in a plastic bag. ? Place a towel between your skin and the bag. ? Leave the ice on for 20 minutes, 2-3 times a day.  Make sure that your baby is latched on and positioned properly while breastfeeding. If engorgement persists after 48 hours of following these recommendations, contact your health care provider or a Advertising copywriter. Overall health care recommendations while breastfeeding  Eat 3 healthy meals and 3 snacks every day. Well-nourished mothers who are breastfeeding need an additional 450-500 calories a day. You can meet this  requirement by increasing the amount of a balanced diet that you eat.  Drink enough water to keep your urine pale yellow or clear.  Rest often, relax, and continue to take your prenatal vitamins to prevent fatigue, stress, and low vitamin and mineral levels in your body (nutrient deficiencies).  Do not use any products that contain nicotine or tobacco, such as cigarettes and e-cigarettes. Your baby may be harmed by chemicals from cigarettes that pass into breast milk and exposure to secondhand smoke. If you need help quitting, ask your health care provider.  Avoid alcohol.  Do not use illegal drugs or marijuana.  Talk with your health care provider before taking any medicines. These include over-the-counter and prescription medicines as well as vitamins and herbal supplements. Some medicines that may be harmful to your baby can pass through breast milk.  It is possible to become pregnant while breastfeeding. If birth control is desired, ask your health care provider about options that will be safe while breastfeeding your baby. Where to find more information: [employer]: www.llli.org Contact a health care provider if:  You feel like you want to stop breastfeeding or have become frustrated with breastfeeding.  Your nipples are cracked or bleeding.  Your breasts are red, tender, or warm.  You have: ? Painful breasts or nipples. ? A swollen area on either breast. ? A fever or chills. ? Nausea or vomiting. ? Drainage other than breast milk from your nipples.  Your breasts do not become full before feedings by the fifth day after you give birth.  You feel sad and depressed.  Your baby is: ? Too sleepy to eat well. ? Having trouble sleeping. ? More than 43 week old and wetting fewer than 6 diapers in a 24-hour period. ? Not gaining weight by 2 days of age.  Your baby has fewer than 3 stools in a 24-hour period.  Your baby's skin or the white parts of his or her  eyes become yellow. Get help right away if:  Your baby is overly tired (lethargic) and does not want to wake up and feed.  Your baby develops an unexplained fever. Summary  Breastfeeding offers many health benefits for infant and mothers.  Try to breastfeed your infant when he or she shows early signs  of hunger.  Gently tickle or stroke your baby's lips with your finger or nipple to allow the baby to open his or her mouth. Bring the baby to your breast. Make sure that much of the areola is in your baby's mouth. Offer one side and burp the baby before you offer the other side.  Talk with your health care provider or lactation consultant if you have questions or you face problems as you breastfeed. This information is not intended to replace advice given to you by your health care provider. Make sure you discuss any questions you have with your health care provider. Document Revised: 09/24/2017 Document Reviewed: 08/01/2016 Elsevier Patient Education  2020 [employer] Inc[employer]  Breast Pumping Tips Breast pumping is a way to get milk out of your breasts. You will then store the milk for your baby to use when you are away from home. There are three ways to pump. You can:  Use your hand to massage and squeeze your breast (hand expression).  Use a hand-held machine to manually pump your milk.  Use an electric machine to pump your milk. In the beginning you may not get much milk. After a few days your breasts should make more. Pumping can help you start making milk after your baby is born. Pumping helps you to keep making milk when you are away from your baby. When should I pump? You can start pumping soon after your baby is born. Follow these tips:  When you are with your baby: ? Pump after you breastfeed. ? Pump from the free breast while you breastfeed.  When you are away from your baby: ? Pump every 2-3 hours for 15 minutes. ? Pump both breasts at the same time if you can.  If your baby  drinks formula, pump around the time your baby gets the formula.  If you drank alcohol, wait 2 hours before you pump.  If you are going to have surgery, ask your doctor when you should pump again. How do I get ready to pump? Take steps to relax. Try these things to help your milk come in:  Smell your baby's blanket or clothes.  Look at a picture or video of your baby.  Sit in a quiet, private space.  Massage your breast and nipple.  Place a cloth on your breast. The cloth should be warm and a little wet.  Play relaxing music.  Picture your milk flowing. What are some tips? General tips for pumping breast milk   Always wash your hands before pumping.  If you do not get much milk or if pumping hurts, try different pump settings or a different kind of pump.  Drink enough fluid so your pee (urine) is clear or pale yellow.  Wear clothing that opens in the front or is easy to take off.  Pump milk into a clean bottle or container.  Do not use anything that has nicotine or tobacco. Examples are cigarettes and e-cigarettes. If you need help quitting, ask your doctor. Tips for storing breast milk   Store breast milk in a clean, BPA-free container. These include: ? A glass or plastic bottle. ? A milk storage bag.  Store only 2-4 ounces of breast milk in each container.  Swirl the breast milk in the container. Do not shake it.  Write down the date you pumped the milk on the container.  This is how long you can store breast milk: ? Room temperature: 6-8 hours. It is best to use  the milk within 4 hours. ? Cooler with ice packs: 24 hours. ? Refrigerator: 5-8 days, if the milk is clean. It is best to use the milk within 3 days. ? Freezer: 9-12 months, if the milk is clean and stored away from the freezer door. It is best to use the milk within 6 months.  Put milk in the back of the refrigerator or freezer.  Thaw frozen milk using warm water. Do not use the microwave. Tips  for choosing a breast pump When choosing a pump, keep the following things in mind:  Manual breast pumps do not need electricity. They cost less. They can be hard to use.  Electric breast pumps use electricity. They are more expensive. They are easier to use. They collect more milk.  The suction cup (flange) should be the right size.  Before you buy the pump, check if your insurance will pay for it. Tips for caring for a breast pump  Check the manual that came with your pump for cleaning tips.  Clean the pump after you use it. To do this: ? Wipe down the electrical part. Use a dry cloth or paper towel. Do not put this part in water or in cleaning products. ? Wash the plastic parts with soap and warm water. Or use the dishwasher if the manual says it is safe. You do not need to clean the tubing unless it touched breast milk. ? Let all the parts air dry. Avoid drying them with a cloth or towel. ? When the parts are clean and dry, put the pump back together. Then store the pump.  If there is water in the tubing when you want to pump: 1. Attach the tubing to the pump. 2. Turn on the pump. 3. Turn off the pump when the tube is dry.  Try not to touch the inside of pump parts. Summary  Pumping can help you start making milk after your baby is born. It lets you keep making milk when you are away from your baby.  When you are away from your baby, pump for about 15 minutes every 2-3 hours. Pump both breasts at the same time, if you can. This information is not intended to replace advice given to you by your health care provider. Make sure you discuss any questions you have with your health care provider. Document Revised: 10/20/2018 Document Reviewed: 08/04/2016 Elsevier Patient Education  2020 [employer].

## 2020-05-18 NOTE — Progress Notes (Signed)
Post Partum Day 2 Subjective: Was getting ready for discharge, then had new onset rigors- uncontrollable shivering about 1.5hrs after Venofer infusion.   Objective: BP 119/71 (BP Location: Left Arm)   Pulse (!) 108   Temp 100.2 F (37.9 C) (Oral)   Resp 17   Ht 5\' 6"  (1.676 m)   Wt 105.2 kg   LMP 08/08/2019   SpO2 100%   Breastfeeding Unknown   BMI 37.45 kg/m    Physical Exam:  General: NAD Breasts: soft/nontender CV: RRR Pulm: nl effort, CTABL Abdomen: soft, NT, BS x 4 Lochia: scant Uterine Fundus: fundus firm and 2 fb below umbilicus DVT Evaluation: no cords, ttp LEs   Recent Labs    05/17/20 0620 05/18/20 0503  HGB 10.3* 8.6*  HCT 30.1* 26.3*  WBC 24.9* 15.5*  PLT 180 154    Assessment/Plan: 29 y.o. G2P1011 postpartum day # 2  - Continue routine PP care - acute reaction to Venofer, discussed with pharmacist on duty and Dr 13/05/21. Added reaction to allergy list. Ordered Solumedrol 40mg  IV now. Will monitor overnight and remain inpatient   Disposition: remain inpatient    Elesa Massed, CNM 05/18/2020  3:08 PM

## 2020-05-19 ENCOUNTER — Ambulatory Visit: Payer: Self-pay

## 2020-05-19 MED ORDER — FAMOTIDINE 20 MG PO TABS: 20.0000 mg | ORAL_TABLET | Freq: Every day | ORAL | 0 refills | Status: AC

## 2020-05-19 NOTE — Progress Notes (Signed)
Pt to be discharged with infant. Discharge instructions, prescriptions, and follow up appointments given to and reviewed with patient. Pt verbalized understanding. To be escorted out by auxillary.  

## 2020-05-19 NOTE — Lactation Note (Signed)
This note was copied from a baby's chart. Lactation Consultation Note  Patient Name: Brandy Coleman [id number] Date: 05/19/2020 Reason for consult: Follow-up assessment;Mother's request;Difficult latch;Primapara;Term;Other (Comment) ([name]'s birth weight was 4540 gms (10 lb 2 oz))  When went in room, [name] was sucking vigorously on a pacifier.  Assisted mom with breast feeding using SNS at the breast with DBM.  Tried without nipple shield without success.  [name] would root for the breast, latch and then come off fussing after a few sucks.  With nipple shield and a few drops of the DBM, he was able to sustain the latch for 25 minutes with one burping in between.  He took 23 ml of DBM at the breast.  Mom seemed very comfortable.  FOB was very supportive praising mom and cheering her on.  Parents had lots of questions which were addressed.  Mom sent home with 100 ml of frozen DBM and what was left in the bottle she was using hoping that her breast milk would be in transitioned by the time she ran out of DBM.Marland Kitchen Mom feels fuller in her left breast, can express more from that breast and mom can feel a difference.  Discussed out patient services and encouraged mom to call with any questons, concerns or assistance.   Maternal Data Formula Feeding for Exclusion: No Has patient been taught Hand Expression?: Yes Does the patient have breastfeeding experience prior to this delivery?: No (P1)  Feeding Feeding Type: Breast Fed  LATCH Score Latch: Repeated attempts needed to sustain latch, nipple held in mouth throughout feeding, stimulation needed to elicit sucking reflex.  Audible Swallowing: Spontaneous and intermittent  Type of Nipple: Everted at rest and after stimulation  Comfort (Breast/Nipple): Filling, red/small blisters or bruises, mild/mod discomfort  Hold (Positioning): Assistance needed to correctly position infant at breast and maintain latch.  LATCH Score:  7  Interventions Interventions: Breast feeding basics reviewed;Assisted with latch;Skin to skin;Breast massage;Hand express;Reverse pressure;Breast compression;Adjust position;Support pillows;Position options;Expressed milk;Coconut oil;DEBP  Lactation Tools Discussed/Used Tools: Nipple Dorris Carnes;Supplemental Nutrition System;Other (comment) (Curved tip syringe) Nipple shield size: 20 WIC Program: No [employer]) Pump Review: Setup, frequency, and cleaning;Milk Storage;Other (comment) Initiated by:: S.Eliberto Ivory, MPH, IBCLC Date initiated:: 05/17/20   Consult Status Consult Status: PRN Follow-up type: Call as needed    Louis Meckel 05/19/2020, 3:47 PM

## 2021-10-13 IMAGING — US US BREAST*L* LIMITED INC AXILLA
1 series · 2 of 2 positions shown · non-contrast
Comparison: None

CLINICAL DATA: 28-year-old female with focal thickening in the
UPPER LEFT breast identified on clinical examination.

EXAM:
ULTRASOUND OF THE LEFT BREAST

[Series 1: us breast*left* limited inc axilla · 0.09mm/px · 2 of 2 slices shown]
[im 1/2]
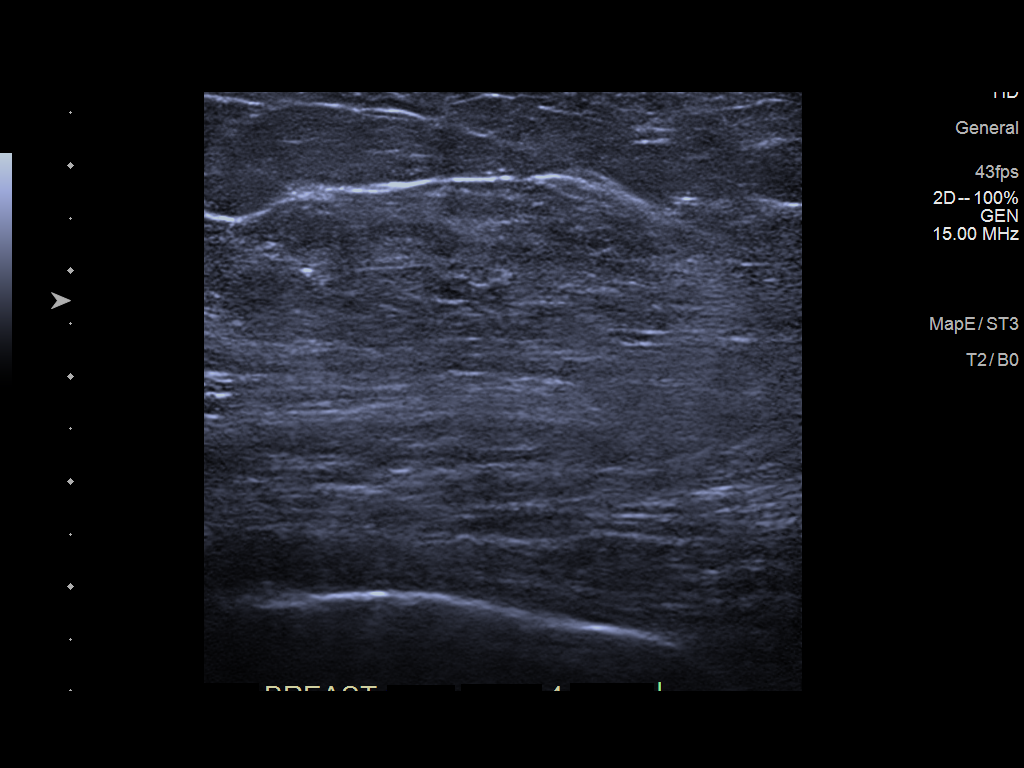
[im 2/2]
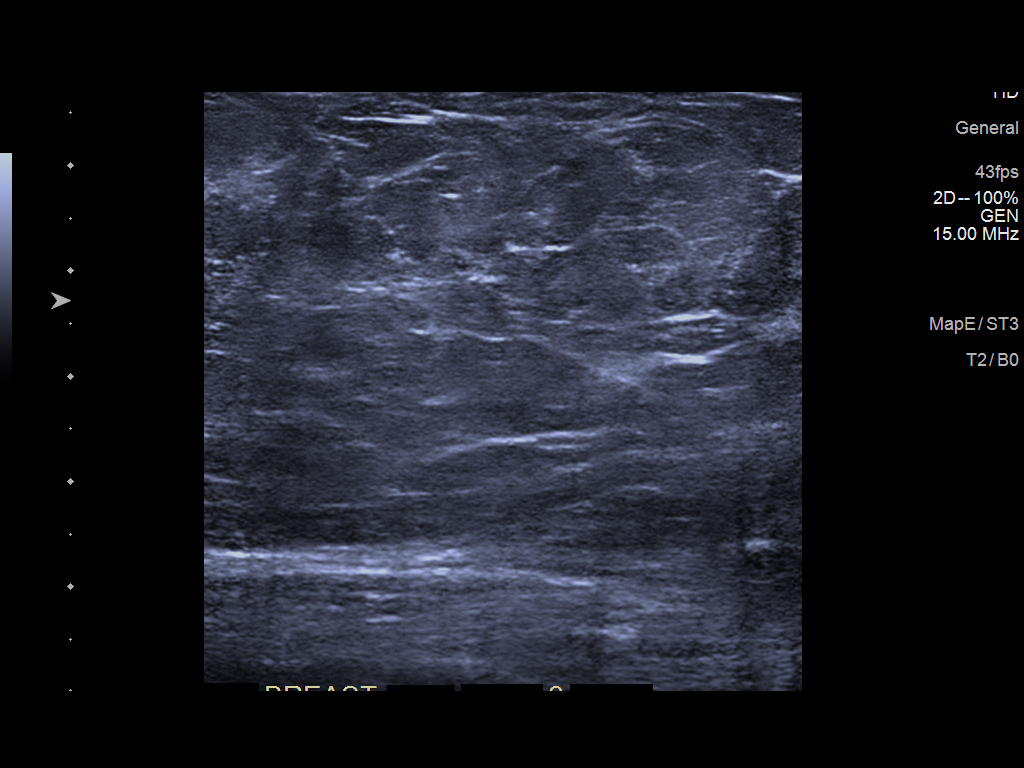

[2 of 2 positions shown; findings below may reference images not displayed]

FINDINGS: On physical exam, focal thickening at the 12 o'clock position of the
LEFT breast 2-3 cm from the nipple noted.

Targeted ultrasound is performed, showing no solid or cystic mass,
distortion or abnormal shadowing within the UPPER LEFT breast.
Normal dense fibroglandular tissue in the area of thickening
identified.
IMPRESSION: No sonographic abnormality within the UPPER LEFT breast,
specifically at the site of clinical concern.

RECOMMENDATION:
Consider clinical follow-up as indicated. Any further workup should
be based on clinical grounds.

Bilateral screening mammogram at age 41.

I have discussed the findings and recommendations with the patient.
If applicable, a reminder letter will be sent to the patient
regarding the next appointment.

BI-RADS CATEGORY  1: Negative.

## 2022-04-02 ENCOUNTER — Ambulatory Visit: Payer: BC Managed Care – PPO | Admitting: Dermatology
